# Patient Record
Sex: Female | Born: 1965 | Race: Black or African American | Hispanic: No | Marital: Single | Smoking: Never smoker
Health system: Southern US, Community
[De-identification: ages and names within clinical notes are randomized; demographics above are authoritative.]

## PROBLEM LIST (undated history)

## (undated) DIAGNOSIS — R55 Syncope and collapse: Secondary | ICD-10-CM

## (undated) DIAGNOSIS — I10 Essential (primary) hypertension: Secondary | ICD-10-CM

## (undated) DIAGNOSIS — I2510 Atherosclerotic heart disease of native coronary artery without angina pectoris: Secondary | ICD-10-CM

## (undated) DIAGNOSIS — E785 Hyperlipidemia, unspecified: Secondary | ICD-10-CM

## (undated) DIAGNOSIS — I219 Acute myocardial infarction, unspecified: Secondary | ICD-10-CM

## (undated) HISTORY — DX: Essential (primary) hypertension: I10

## (undated) HISTORY — DX: Acute myocardial infarction, unspecified: I21.9

## (undated) HISTORY — DX: Atherosclerotic heart disease of native coronary artery without angina pectoris: I25.10

## (undated) HISTORY — DX: Syncope and collapse: R55

## (undated) HISTORY — DX: Hyperlipidemia, unspecified: E78.5

---

## 2004-05-11 ENCOUNTER — Other Ambulatory Visit: Admission: RE | Admit: 2004-05-11 | Discharge: 2004-05-11 | Payer: Self-pay | Admitting: Obstetrics and Gynecology

## 2004-05-12 ENCOUNTER — Other Ambulatory Visit: Admission: RE | Admit: 2004-05-12 | Discharge: 2004-05-12 | Payer: Self-pay | Admitting: Obstetrics and Gynecology

## 2005-04-23 ENCOUNTER — Ambulatory Visit: Payer: Self-pay | Admitting: Internal Medicine

## 2005-04-30 ENCOUNTER — Ambulatory Visit: Payer: Self-pay | Admitting: *Deleted

## 2005-05-18 ENCOUNTER — Ambulatory Visit: Payer: Self-pay | Admitting: Internal Medicine

## 2005-05-21 ENCOUNTER — Ambulatory Visit: Payer: Self-pay | Admitting: Family Medicine

## 2005-05-25 ENCOUNTER — Ambulatory Visit: Payer: Self-pay | Admitting: Internal Medicine

## 2005-06-24 ENCOUNTER — Encounter (INDEPENDENT_AMBULATORY_CARE_PROVIDER_SITE_OTHER): Payer: Self-pay | Admitting: Internal Medicine

## 2005-06-24 LAB — CONVERTED CEMR LAB

## 2005-06-29 ENCOUNTER — Ambulatory Visit: Payer: Self-pay | Admitting: Internal Medicine

## 2005-07-02 ENCOUNTER — Ambulatory Visit: Payer: Self-pay | Admitting: Internal Medicine

## 2005-07-27 ENCOUNTER — Ambulatory Visit: Payer: Self-pay | Admitting: Family Medicine

## 2005-08-10 ENCOUNTER — Inpatient Hospital Stay (HOSPITAL_COMMUNITY): Admission: AD | Admit: 2005-08-10 | Discharge: 2005-08-10 | Payer: Self-pay | Admitting: Obstetrics and Gynecology

## 2005-11-16 ENCOUNTER — Ambulatory Visit: Payer: Self-pay | Admitting: Internal Medicine

## 2006-02-14 ENCOUNTER — Ambulatory Visit: Payer: Self-pay | Admitting: Internal Medicine

## 2006-03-21 ENCOUNTER — Ambulatory Visit: Payer: Self-pay | Admitting: Family Medicine

## 2006-10-04 ENCOUNTER — Ambulatory Visit: Payer: Self-pay | Admitting: Internal Medicine

## 2006-11-23 LAB — CONVERTED CEMR LAB

## 2006-12-16 ENCOUNTER — Ambulatory Visit: Payer: Self-pay | Admitting: Family Medicine

## 2007-03-22 ENCOUNTER — Inpatient Hospital Stay (HOSPITAL_COMMUNITY): Admission: AD | Admit: 2007-03-22 | Discharge: 2007-03-22 | Payer: Self-pay | Admitting: Obstetrics and Gynecology

## 2007-03-24 ENCOUNTER — Ambulatory Visit (HOSPITAL_COMMUNITY): Admission: RE | Admit: 2007-03-24 | Discharge: 2007-03-24 | Payer: Self-pay | Admitting: Obstetrics and Gynecology

## 2007-06-10 ENCOUNTER — Telehealth (INDEPENDENT_AMBULATORY_CARE_PROVIDER_SITE_OTHER): Payer: Self-pay | Admitting: *Deleted

## 2007-06-11 ENCOUNTER — Encounter (INDEPENDENT_AMBULATORY_CARE_PROVIDER_SITE_OTHER): Payer: Self-pay | Admitting: *Deleted

## 2007-06-13 ENCOUNTER — Encounter (INDEPENDENT_AMBULATORY_CARE_PROVIDER_SITE_OTHER): Payer: Self-pay | Admitting: Internal Medicine

## 2007-06-13 DIAGNOSIS — J309 Allergic rhinitis, unspecified: Secondary | ICD-10-CM | POA: Insufficient documentation

## 2007-06-13 DIAGNOSIS — M542 Cervicalgia: Secondary | ICD-10-CM

## 2007-06-13 DIAGNOSIS — I10 Essential (primary) hypertension: Secondary | ICD-10-CM

## 2007-06-16 ENCOUNTER — Ambulatory Visit: Payer: Self-pay | Admitting: Family Medicine

## 2007-06-16 LAB — CONVERTED CEMR LAB
ALT: 9 units/L (ref 0–35)
AST: 12 units/L (ref 0–37)
Albumin: 4.4 g/dL (ref 3.5–5.2)
Alkaline Phosphatase: 83 units/L (ref 39–117)
BUN: 16 mg/dL (ref 6–23)
Basophils Absolute: 0 10*3/uL (ref 0.0–0.1)
Basophils Relative: 0 % (ref 0–1)
CO2: 29 meq/L (ref 19–32)
Calcium: 9.2 mg/dL (ref 8.4–10.5)
Chloride: 101 meq/L (ref 96–112)
Cholesterol: 191 mg/dL (ref 0–200)
Creatinine, Ser: 0.88 mg/dL (ref 0.40–1.20)
Eosinophils Absolute: 0 10*3/uL (ref 0.0–0.7)
Eosinophils Relative: 1 % (ref 0–5)
Glucose, Bld: 97 mg/dL (ref 70–99)
HCT: 40.5 % (ref 36.0–46.0)
HDL: 75 mg/dL (ref >39)
Hemoglobin: 12.7 g/dL (ref 12.0–15.0)
LDL Cholesterol: 95 mg/dL (ref 0–99)
Lymphocytes Relative: 28 % (ref 12–46)
Lymphs Abs: 1.6 10*3/uL (ref 0.7–3.3)
MCHC: 31.4 g/dL (ref 30.0–36.0)
MCV: 92 fL (ref 78.0–100.0)
Monocytes Absolute: 0.3 10*3/uL (ref 0.2–0.7)
Monocytes Relative: 4 % (ref 3–11)
Neutro Abs: 3.8 10*3/uL (ref 1.7–7.7)
Neutrophils Relative %: 66 % (ref 43–77)
Platelets: 248 10*3/uL (ref 150–400)
Potassium: 4.2 meq/L (ref 3.5–5.3)
RBC: 4.4 M/uL (ref 3.87–5.11)
RDW: 13.9 % (ref 11.5–14.0)
Sodium: 142 meq/L (ref 135–145)
Total Bilirubin: 0.4 mg/dL (ref 0.3–1.2)
Total CHOL/HDL Ratio: 2.5
Total Protein: 7.3 g/dL (ref 6.0–8.3)
Triglycerides: 106 mg/dL (ref <150)
VLDL: 21 mg/dL (ref 0–40)
WBC: 5.7 10*3/uL (ref 4.0–10.5)

## 2008-03-23 ENCOUNTER — Ambulatory Visit: Payer: Self-pay | Admitting: Family Medicine

## 2008-03-23 ENCOUNTER — Encounter (INDEPENDENT_AMBULATORY_CARE_PROVIDER_SITE_OTHER): Payer: Self-pay | Admitting: Family Medicine

## 2008-03-23 LAB — CONVERTED CEMR LAB
Bilirubin Urine: NEGATIVE
Chlamydia, DNA Probe: NEGATIVE
GC Probe Amp, Genital: NEGATIVE
Glucose, Urine, Semiquant: NEGATIVE
Nitrite: NEGATIVE
Pap Smear: NORMAL
Protein, U semiquant: 30
Specific Gravity, Urine: 1.02
Urobilinogen, UA: 1
WBC Urine, dipstick: NEGATIVE
pH: 6.5

## 2008-04-09 ENCOUNTER — Telehealth (INDEPENDENT_AMBULATORY_CARE_PROVIDER_SITE_OTHER): Payer: Self-pay | Admitting: *Deleted

## 2008-04-15 ENCOUNTER — Ambulatory Visit (HOSPITAL_COMMUNITY): Admission: RE | Admit: 2008-04-15 | Discharge: 2008-04-15 | Payer: Self-pay | Admitting: Family Medicine

## 2008-04-22 ENCOUNTER — Telehealth (INDEPENDENT_AMBULATORY_CARE_PROVIDER_SITE_OTHER): Payer: Self-pay | Admitting: *Deleted

## 2008-04-30 ENCOUNTER — Ambulatory Visit: Payer: Self-pay | Admitting: Family Medicine

## 2008-06-08 ENCOUNTER — Telehealth (INDEPENDENT_AMBULATORY_CARE_PROVIDER_SITE_OTHER): Payer: Self-pay | Admitting: *Deleted

## 2008-06-15 ENCOUNTER — Ambulatory Visit: Payer: Self-pay | Admitting: Family Medicine

## 2008-06-15 DIAGNOSIS — N760 Acute vaginitis: Secondary | ICD-10-CM | POA: Insufficient documentation

## 2009-01-25 ENCOUNTER — Ambulatory Visit: Payer: Self-pay | Admitting: Family Medicine

## 2009-01-25 DIAGNOSIS — B356 Tinea cruris: Secondary | ICD-10-CM

## 2009-01-25 LAB — CONVERTED CEMR LAB: Blood Glucose, Fingerstick: 98

## 2009-05-31 ENCOUNTER — Ambulatory Visit: Payer: Self-pay | Admitting: Physician Assistant

## 2009-06-01 DIAGNOSIS — R82998 Other abnormal findings in urine: Secondary | ICD-10-CM

## 2009-06-03 ENCOUNTER — Ambulatory Visit: Payer: Self-pay | Admitting: Physician Assistant

## 2009-06-03 LAB — CONVERTED CEMR LAB
Blood Glucose, AC Bkfst: 88 mg/dL
Microalb, Ur: 5.21 mg/dL — ABNORMAL HIGH (ref 0.00–1.89)
Rapid HIV Screen: NEGATIVE

## 2009-06-04 ENCOUNTER — Encounter: Payer: Self-pay | Admitting: Physician Assistant

## 2009-06-06 ENCOUNTER — Encounter: Payer: Self-pay | Admitting: Physician Assistant

## 2009-06-06 ENCOUNTER — Telehealth: Payer: Self-pay | Admitting: Physician Assistant

## 2009-06-10 ENCOUNTER — Encounter: Payer: Self-pay | Admitting: Physician Assistant

## 2009-06-10 DIAGNOSIS — R809 Proteinuria, unspecified: Secondary | ICD-10-CM | POA: Insufficient documentation

## 2009-06-22 ENCOUNTER — Ambulatory Visit (HOSPITAL_COMMUNITY): Admission: RE | Admit: 2009-06-22 | Discharge: 2009-06-22 | Payer: Self-pay | Admitting: Internal Medicine

## 2009-06-27 ENCOUNTER — Telehealth (INDEPENDENT_AMBULATORY_CARE_PROVIDER_SITE_OTHER): Payer: Self-pay | Admitting: Nurse Practitioner

## 2009-07-13 ENCOUNTER — Encounter (INDEPENDENT_AMBULATORY_CARE_PROVIDER_SITE_OTHER): Payer: Self-pay | Admitting: Nurse Practitioner

## 2009-07-21 ENCOUNTER — Other Ambulatory Visit: Admission: RE | Admit: 2009-07-21 | Discharge: 2009-07-21 | Payer: Self-pay | Admitting: Internal Medicine

## 2009-07-21 ENCOUNTER — Encounter (INDEPENDENT_AMBULATORY_CARE_PROVIDER_SITE_OTHER): Payer: Self-pay | Admitting: Nurse Practitioner

## 2009-07-21 ENCOUNTER — Ambulatory Visit: Payer: Self-pay | Admitting: Nurse Practitioner

## 2009-07-21 DIAGNOSIS — E669 Obesity, unspecified: Secondary | ICD-10-CM

## 2009-07-21 DIAGNOSIS — R7309 Other abnormal glucose: Secondary | ICD-10-CM

## 2009-07-21 LAB — CONVERTED CEMR LAB

## 2009-07-22 ENCOUNTER — Encounter (INDEPENDENT_AMBULATORY_CARE_PROVIDER_SITE_OTHER): Payer: Self-pay | Admitting: Nurse Practitioner

## 2009-08-08 ENCOUNTER — Encounter (INDEPENDENT_AMBULATORY_CARE_PROVIDER_SITE_OTHER): Payer: Self-pay | Admitting: *Deleted

## 2009-10-06 ENCOUNTER — Ambulatory Visit: Payer: Self-pay | Admitting: Internal Medicine

## 2009-10-06 LAB — CONVERTED CEMR LAB
Bilirubin Urine: NEGATIVE
Glucose, Urine, Semiquant: NEGATIVE
Ketones, urine, test strip: NEGATIVE
Nitrite: NEGATIVE
Protein, U semiquant: 30
Specific Gravity, Urine: 1.025
Urobilinogen, UA: 0.2
pH: 6

## 2009-10-07 ENCOUNTER — Encounter (INDEPENDENT_AMBULATORY_CARE_PROVIDER_SITE_OTHER): Payer: Self-pay | Admitting: Nurse Practitioner

## 2009-10-10 ENCOUNTER — Encounter (INDEPENDENT_AMBULATORY_CARE_PROVIDER_SITE_OTHER): Payer: Self-pay | Admitting: Nurse Practitioner

## 2009-11-07 ENCOUNTER — Ambulatory Visit: Payer: Self-pay | Admitting: Nurse Practitioner

## 2010-10-15 ENCOUNTER — Encounter: Payer: Self-pay | Admitting: Internal Medicine

## 2010-10-16 ENCOUNTER — Encounter: Payer: Self-pay | Admitting: Family Medicine

## 2010-10-22 LAB — CONVERTED CEMR LAB
ALT: 13 units/L (ref 0–35)
AST: 16 units/L (ref 0–37)
Albumin: 4.5 g/dL (ref 3.5–5.2)
Alkaline Phosphatase: 73 units/L (ref 39–117)
BUN: 12 mg/dL (ref 6–23)
BUN: 14 mg/dL (ref 6–23)
Basophils Absolute: 0 10*3/uL (ref 0.0–0.1)
Basophils Relative: 0 % (ref 0–1)
Bilirubin Urine: NEGATIVE
Blood Glucose, Fingerstick: 108
CO2: 23 meq/L (ref 19–32)
CO2: 28 meq/L (ref 19–32)
Calcium: 9.6 mg/dL (ref 8.4–10.5)
Calcium: 9.8 mg/dL (ref 8.4–10.5)
Chlamydia, DNA Probe: NEGATIVE
Chloride: 103 meq/L (ref 96–112)
Chloride: 104 meq/L (ref 96–112)
Creatinine, Ser: 0.86 mg/dL (ref 0.40–1.20)
Creatinine, Ser: 0.96 mg/dL (ref 0.40–1.20)
Eosinophils Absolute: 0.1 10*3/uL (ref 0.0–0.7)
Eosinophils Relative: 1 % (ref 0–5)
GC Probe Amp, Genital: NEGATIVE
Glucose, Bld: 103 mg/dL — ABNORMAL HIGH (ref 70–99)
Glucose, Bld: 126 mg/dL — ABNORMAL HIGH (ref 70–99)
Glucose, Urine, Semiquant: NEGATIVE
HCT: 38 % (ref 36.0–46.0)
Hemoglobin: 12.1 g/dL (ref 12.0–15.0)
Hep B S AB Quant (Post): 256 milliintl units/mL
Hep B S Ab: POSITIVE — AB
Hgb A1c MFr Bld: 5.7 %
KOH Prep: NEGATIVE
Lymphocytes Relative: 29 % (ref 12–46)
Lymphs Abs: 2.4 10*3/uL (ref 0.7–4.0)
MCHC: 31.8 g/dL (ref 30.0–36.0)
MCV: 88 fL (ref 78.0–100.0)
Microalb, Ur: 0.66 mg/dL (ref 0.00–1.89)
Monocytes Absolute: 0.4 10*3/uL (ref 0.1–1.0)
Monocytes Relative: 5 % (ref 3–12)
Neutro Abs: 5.4 10*3/uL (ref 1.7–7.7)
Neutrophils Relative %: 65 % (ref 43–77)
Nitrite: NEGATIVE
OCCULT 1: NEGATIVE
Platelets: 260 10*3/uL (ref 150–400)
Potassium: 3.6 meq/L (ref 3.5–5.3)
Potassium: 4.2 meq/L (ref 3.5–5.3)
Protein, U semiquant: NEGATIVE
RBC: 4.32 M/uL (ref 3.87–5.11)
RDW: 14.1 % (ref 11.5–15.5)
Sodium: 140 meq/L (ref 135–145)
Sodium: 143 meq/L (ref 135–145)
Specific Gravity, Urine: 1.02
TSH: 0.909 microintl units/mL (ref 0.350–4.500)
Total Bilirubin: 0.3 mg/dL (ref 0.3–1.2)
Total Protein: 7 g/dL (ref 6.0–8.3)
Urobilinogen, UA: 0.2
WBC Urine, dipstick: NEGATIVE
WBC: 8.2 10*3/uL (ref 4.0–10.5)
pH: 5

## 2010-10-25 NOTE — Letter (Signed)
Summary: *HSN Results Follow up  [facility]  [address] [place], [place] [address]   Phone: [phone]  Fax: [phone]      10/10/2009   Jill Ball [address] [place], [place]  [address]   Dear  Jill Ball,                            ____S.Drinkard,FNP   ____D. Gore,FNP       ____B. McPherson,MD   ____V. Rankins,MD    ____E. Mulberry,MD    __X__N. Daphine Deutscher, FNP  ____D. Reche Dixon, MD    ____K. Philipp Deputy, MD    ____Other     This letter is to inform you that your recent test(s):  _______Pap Smear    ___X____Lab Test     _______X-ray    __X_____ is within acceptable limits  _______ requires a medication change  _______ requires a follow-up lab visit  _______ requires a follow-up visit with your provider   Comments: Urine culture is now ok. Infection has cleared.       _________________________________________________________ If you have any questions, please contact our office [phone].                    Sincerely,    Jill Prom FNP [facility]

## 2010-10-25 NOTE — Assessment & Plan Note (Signed)
Summary: Acute - Allergic Rhinitis   Vital Signs:  Patient profile:   45 year old female LMP:     09/2009 Weight:      227.5 pounds BMI:     37.42 BSA:     2.10 Temp:     98.8 degrees F oral Pulse rate:   103 / minute Pulse rhythm:   regular Resp:     20 per minute BP sitting:   132 / 85  (left arm) Cuff size:   large  Vitals Entered By: Levon Hedger (November 07, 2009 2:10 PM) CC: cough x 1 1/2 week wake up with headache, dry mouth, tiredness, chest soreness, and throat tickles alot, Hypertension Management Is Patient Diabetic? No Pain Assessment Patient in pain? no       Does patient need assistance? Functional Status Self care Ambulation Normal LMP (date): 09/2009 LMP - Character: normal     Enter LMP: 09/2009 Last PAP Result  Specimen Adequacy: Satisfactory for evaluation.   Interpretation/Result:Negative for intraepithelial Lesion or Malignancy.   Interpretation/Result:Shift in flora c/w Bacterial Vaginosis.  Interpretation/Result:Reactive Changes.     Primary Care Provider:  Tereso Newcomer PA-C  CC:  cough x 1 1/2 week wake up with headache, dry mouth, tiredness, chest soreness, and throat tickles alot, and Hypertension Management.  History of Present Illness:  Pt into the office with complaints of upper respiratory symptoms that started 2 weeks ago. +dry cough (non-productive) +headache +sneezing -ear pain Sore throat from cough  +chest pain from cough  +dry mouth  -nausea  -vomiting  Took Tussin DM for cough without resolution of symptoms  Hypertension History:      She denies headache, chest pain, and palpitations.  She notes no problems with any antihypertensive medication side effects.        Positive major cardiovascular risk factors include hypertension.  Negative major cardiovascular risk factors include female age less than 69 years old, no history of diabetes, negative family history for ischemic heart disease, and non-tobacco-user status.          Further assessment for target organ damage reveals no history of ASHD, cardiac end-organ damage (CHF/LVH), stroke/TIA, peripheral vascular disease, renal insufficiency, or hypertensive retinopathy.     Habits & Providers  Alcohol-Tobacco-Diet     Alcohol drinks/day: 0     Tobacco Status: never     Passive Smoke Exposure: no  Exercise-Depression-Behavior     Does Patient Exercise: no     Depression Counseling: not indicated; screening negative for depression     Drug Use: no     Seat Belt Use: 100     Sun Exposure: occasionally  Comments: admits to some Second hand smoke from her mother  Allergies (verified): No Known Drug Allergies  Review of Systems General:  Denies fever. ENT:  Denies earache, nasal congestion, and sore throat. CV:  Denies chest pain or discomfort. Resp:  Complains of cough. GI:  Denies abdominal pain, nausea, and vomiting.  Physical Exam  General:  alert.   Head:  normocephalic.   Ears:  bil TM with clear fluid Nose:  inflammed turbinates Mouth:  fair dentition.   Lungs:  normal breath sounds.   Heart:  normal rate and regular rhythm.   Abdomen:  normal bowel sounds.   Neurologic:  alert & oriented X3.     Impression & Recommendations:  Problem # 1:  ALLERGIC RHINITIS, CHRONIC (ICD-477.9) advised pt of dx handout given Her updated medication list for this problem includes:  Fluticasone Propionate 50 Mcg/act Susp (Fluticasone propionate) ..... One inhalation two times a day  **hold head down""    Loratadine 10 Mg Tabs (Loratadine) ..... One tablet by mouth daily for allergies  Complete Medication List: 1)  Hydrochlorothiazide 25 Mg Tabs (Hydrochlorothiazide) .... Take one a pill each morning 2)  Fluticasone Propionate 50 Mcg/act Susp (Fluticasone propionate) .... One inhalation two times a day  **hold head down"" 3)  Loratadine 10 Mg Tabs (Loratadine) .... One tablet by mouth daily for allergies  Hypertension Assessment/Plan:       The patient's hypertensive risk group is category A: No risk factors and no target organ damage.  Her calculated 10 year risk of coronary heart disease is 1 %.  Today's blood pressure is 132/85.  Her blood pressure goal is < 140/90.  Patient Instructions: 1)  Symptoms are likely caused by allergic triggers. 2)  Use humidifier or boil hot water on the stove 3)  avoid triggers 4)  Use nasal spray in each nostril two times a day (hold head down) 5)  AND 6)  loratadine 10mg  by mouth daily 7)  Follow up as needed Prescriptions: LORATADINE 10 MG TABS (LORATADINE) One tablet by mouth daily for allergies  #30 x 3   Entered and Authorized by:   Lehman Prom FNP   Signed by:   Lehman Prom FNP on 11/07/2009   Method used:   Print then Give to Patient   RxID:   [id number] FLUTICASONE PROPIONATE 50 MCG/ACT SUSP (FLUTICASONE PROPIONATE) One inhalation two times a day  **hold head down""  #1 x 3   Entered and Authorized by:   Lehman Prom FNP   Signed by:   Lehman Prom FNP on 11/07/2009   Method used:   Print then Give to Patient   RxID:   [id number]

## 2011-07-11 LAB — URINALYSIS, ROUTINE W REFLEX MICROSCOPIC
Bilirubin Urine: NEGATIVE
Glucose, UA: NEGATIVE
Ketones, ur: 15 — AB
Leukocytes, UA: NEGATIVE
Nitrite: NEGATIVE
Protein, ur: NEGATIVE
Specific Gravity, Urine: 1.025
Urobilinogen, UA: 1
pH: 6

## 2011-07-11 LAB — DIFFERENTIAL
Basophils Absolute: 0
Basophils Relative: 0
Eosinophils Absolute: 0.1
Eosinophils Relative: 1
Lymphs Abs: 1.5
Neutrophils Relative %: 70

## 2011-07-11 LAB — CBC
HCT: 38.3
Hemoglobin: 12.7
MCHC: 33.1
MCV: 86.9
Platelets: 234
RBC: 4.41
RDW: 13.9
WBC: 6.5

## 2011-07-11 LAB — URINE MICROSCOPIC-ADD ON

## 2011-07-11 LAB — POCT PREGNANCY, URINE
Operator id: 263291
Preg Test, Ur: NEGATIVE

## 2012-09-24 HISTORY — PX: CORONARY ANGIOPLASTY: SHX604

## 2013-01-18 ENCOUNTER — Ambulatory Visit: Payer: Self-pay | Admitting: Internal Medicine

## 2013-02-17 ENCOUNTER — Encounter: Payer: Self-pay | Admitting: Physician Assistant

## 2013-02-22 ENCOUNTER — Encounter: Payer: Self-pay | Admitting: Physician Assistant

## 2013-03-24 ENCOUNTER — Encounter: Payer: Self-pay | Admitting: Physician Assistant

## 2013-06-24 HISTORY — PX: CARDIAC CATHETERIZATION: SHX172

## 2013-07-06 LAB — BASIC METABOLIC PANEL
Anion Gap: 7 (ref 7–16)
BUN: 16 mg/dL (ref 7–18)
Calcium, Total: 9 mg/dL (ref 8.5–10.1)
Co2: 27 mmol/L (ref 21–32)
Creatinine: 0.96 mg/dL (ref 0.60–1.30)
EGFR (Non-African Amer.): >60
Glucose: 96 mg/dL (ref 65–99)
Osmolality: 277 (ref 275–301)
Potassium: 3.6 mmol/L (ref 3.5–5.1)
Sodium: 138 mmol/L (ref 136–145)

## 2013-07-06 LAB — TROPONIN I: Troponin-I: 6.8 ng/mL — ABNORMAL HIGH

## 2013-07-06 LAB — CBC
HGB: 11.7 g/dL — ABNORMAL LOW (ref 12.0–16.0)
MCH: 28.4 pg (ref 26.0–34.0)
MCV: 84 fL (ref 80–100)
Platelet: 229 10*3/uL (ref 150–440)

## 2013-07-07 ENCOUNTER — Inpatient Hospital Stay: Payer: Self-pay | Admitting: Internal Medicine

## 2013-07-07 DIAGNOSIS — R079 Chest pain, unspecified: Secondary | ICD-10-CM

## 2013-07-07 DIAGNOSIS — I359 Nonrheumatic aortic valve disorder, unspecified: Secondary | ICD-10-CM

## 2013-07-07 DIAGNOSIS — I2510 Atherosclerotic heart disease of native coronary artery without angina pectoris: Secondary | ICD-10-CM

## 2013-07-07 DIAGNOSIS — I10 Essential (primary) hypertension: Secondary | ICD-10-CM

## 2013-07-07 DIAGNOSIS — R55 Syncope and collapse: Secondary | ICD-10-CM

## 2013-07-07 DIAGNOSIS — I214 Non-ST elevation (NSTEMI) myocardial infarction: Secondary | ICD-10-CM

## 2013-07-07 LAB — CK TOTAL AND CKMB (NOT AT ARMC)
CK, Total: 334 U/L — ABNORMAL HIGH (ref 21–215)
CK, Total: 254 U/L — ABNORMAL HIGH (ref 21–215)
CK-MB: 7.1 ng/mL — ABNORMAL HIGH (ref 0.5–3.6)
CK-MB: 3 ng/mL (ref 0.5–3.6)

## 2013-07-07 LAB — LIPID PANEL
Cholesterol: 167 mg/dL (ref 0–200)
Ldl Cholesterol, Calc: 74 mg/dL (ref 0–100)
Triglycerides: 94 mg/dL (ref 0–200)
VLDL Cholesterol, Calc: 19 mg/dL (ref 5–40)

## 2013-07-07 LAB — TROPONIN I
Troponin-I: 5.6 ng/mL — ABNORMAL HIGH
Troponin-I: 4 ng/mL — ABNORMAL HIGH
Troponin-I: 3.4 ng/mL — ABNORMAL HIGH

## 2013-07-07 LAB — HCG, QUANTITATIVE, PREGNANCY: Beta Hcg, Quant.: <1 m[IU]/mL — ABNORMAL LOW

## 2013-07-08 ENCOUNTER — Encounter: Payer: Self-pay | Admitting: Cardiovascular Disease

## 2013-07-08 DIAGNOSIS — I214 Non-ST elevation (NSTEMI) myocardial infarction: Secondary | ICD-10-CM

## 2013-07-08 LAB — CBC WITH DIFFERENTIAL/PLATELET
Basophil #: 0 10*3/uL (ref 0.0–0.1)
HCT: 34.7 % — ABNORMAL LOW (ref 35.0–47.0)
Lymphocyte #: 1.7 10*3/uL (ref 1.0–3.6)
Lymphocyte %: 23.9 %
MCHC: 33.6 g/dL (ref 32.0–36.0)
MCV: 85 fL (ref 80–100)
Monocyte #: 0.4 x10 3/mm (ref 0.2–0.9)
Monocyte %: 5.4 %
Neutrophil #: 5 10*3/uL (ref 1.4–6.5)
Neutrophil %: 69.7 %
Platelet: 210 10*3/uL (ref 150–440)
RDW: 14.9 % — ABNORMAL HIGH (ref 11.5–14.5)

## 2013-07-08 LAB — BASIC METABOLIC PANEL
BUN: 10 mg/dL (ref 7–18)
EGFR (African American): >60
Osmolality: 273 (ref 275–301)
Potassium: 3.8 mmol/L (ref 3.5–5.1)
Sodium: 137 mmol/L (ref 136–145)

## 2013-07-09 ENCOUNTER — Inpatient Hospital Stay: Payer: Self-pay | Admitting: Internal Medicine

## 2013-07-09 ENCOUNTER — Telehealth: Payer: Self-pay

## 2013-07-09 LAB — APTT: Activated PTT: 32 secs (ref 23.6–35.9)

## 2013-07-09 LAB — COMPREHENSIVE METABOLIC PANEL
BUN: 11 mg/dL (ref 7–18)
Bilirubin,Total: 0.3 mg/dL (ref 0.2–1.0)
Calcium, Total: 9.1 mg/dL (ref 8.5–10.1)
Creatinine: 0.97 mg/dL (ref 0.60–1.30)
EGFR (African American): >60
EGFR (Non-African Amer.): >60
Osmolality: 274 (ref 275–301)
Potassium: 3.5 mmol/L (ref 3.5–5.1)
SGOT(AST): 32 U/L (ref 15–37)
Sodium: 138 mmol/L (ref 136–145)
Total Protein: 7.6 g/dL (ref 6.4–8.2)

## 2013-07-09 LAB — CK TOTAL AND CKMB (NOT AT ARMC)
CK, Total: 131 U/L (ref 21–215)
CK-MB: 0.7 ng/mL (ref 0.5–3.6)

## 2013-07-09 LAB — CBC
MCHC: 33.4 g/dL (ref 32.0–36.0)
MCV: 84 fL (ref 80–100)
RBC: 4.34 10*6/uL (ref 3.80–5.20)
WBC: 8.8 10*3/uL (ref 3.6–11.0)

## 2013-07-09 NOTE — Telephone Encounter (Signed)
Spoke w/ pt.  She states that she was recently released from [facility] s/p MI, s/p cath and stent placement, was told to call our office if symptoms recurred. States that she is in her car, her son is driving, she is having chest discomfort, stomach is burning "like acid reflux pain".  She took her ranitidine, but it did not help. Chest discomfort x 2 hourse. States that the discomfort is worsening, radiating to her neck, she is short of breath, and nauseous.  Pt reports that "I felt this same way when I had my heart attack". Pt will have her son take her to the emergency department, as she is "scared it is happening again".

## 2013-07-10 DIAGNOSIS — E785 Hyperlipidemia, unspecified: Secondary | ICD-10-CM

## 2013-07-10 DIAGNOSIS — I2510 Atherosclerotic heart disease of native coronary artery without angina pectoris: Secondary | ICD-10-CM

## 2013-07-10 DIAGNOSIS — R079 Chest pain, unspecified: Secondary | ICD-10-CM

## 2013-07-10 DIAGNOSIS — E876 Hypokalemia: Secondary | ICD-10-CM

## 2013-07-10 DIAGNOSIS — Z9861 Coronary angioplasty status: Secondary | ICD-10-CM

## 2013-07-10 LAB — CBC WITH DIFFERENTIAL/PLATELET
Basophil #: 0 10*3/uL (ref 0.0–0.1)
Eosinophil %: 0.7 %
HCT: 31.4 % — ABNORMAL LOW (ref 35.0–47.0)
HGB: 10.7 g/dL — ABNORMAL LOW (ref 12.0–16.0)
MCHC: 34.1 g/dL (ref 32.0–36.0)
Monocyte #: 0.5 x10 3/mm (ref 0.2–0.9)
Neutrophil #: 5 10*3/uL (ref 1.4–6.5)
Platelet: 213 10*3/uL (ref 150–440)
WBC: 7.8 10*3/uL (ref 3.6–11.0)

## 2013-07-10 LAB — TROPONIN I
Troponin-I: 1.2 ng/mL — ABNORMAL HIGH
Troponin-I: 0.71 ng/mL — ABNORMAL HIGH

## 2013-07-10 LAB — BASIC METABOLIC PANEL
Anion Gap: 4 — ABNORMAL LOW (ref 7–16)
BUN: 12 mg/dL (ref 7–18)
Chloride: 107 mmol/L (ref 98–107)
Creatinine: 0.91 mg/dL (ref 0.60–1.30)
EGFR (African American): >60
EGFR (Non-African Amer.): >60
Glucose: 106 mg/dL — ABNORMAL HIGH (ref 65–99)
Osmolality: 281 (ref 275–301)
Potassium: 3.3 mmol/L — ABNORMAL LOW (ref 3.5–5.1)
Sodium: 141 mmol/L (ref 136–145)

## 2013-07-10 LAB — APTT
Activated PTT: 132.1 secs — ABNORMAL HIGH (ref 23.6–35.9)
Activated PTT: 108.5 secs — ABNORMAL HIGH (ref 23.6–35.9)

## 2013-07-10 LAB — CK TOTAL AND CKMB (NOT AT ARMC): CK, Total: 98 U/L (ref 21–215)

## 2013-07-12 ENCOUNTER — Emergency Department: Payer: Self-pay | Admitting: Emergency Medicine

## 2013-07-12 LAB — COMPREHENSIVE METABOLIC PANEL
Albumin: 3.5 g/dL (ref 3.4–5.0)
Alkaline Phosphatase: 75 U/L (ref 50–136)
Anion Gap: 4 — ABNORMAL LOW (ref 7–16)
BUN: 8 mg/dL (ref 7–18)
Bilirubin,Total: 0.3 mg/dL (ref 0.2–1.0)
Chloride: 109 mmol/L — ABNORMAL HIGH (ref 98–107)
Co2: 26 mmol/L (ref 21–32)
Creatinine: 0.81 mg/dL (ref 0.60–1.30)
EGFR (African American): >60
EGFR (Non-African Amer.): >60
SGOT(AST): 30 U/L (ref 15–37)
SGPT (ALT): 18 U/L (ref 12–78)
Total Protein: 7 g/dL (ref 6.4–8.2)

## 2013-07-12 LAB — CBC WITH DIFFERENTIAL/PLATELET
Basophil %: 0.4 %
Eosinophil #: 0 10*3/uL (ref 0.0–0.7)
Eosinophil %: 0.6 %
HCT: 35.2 % (ref 35.0–47.0)
HGB: 11.7 g/dL — ABNORMAL LOW (ref 12.0–16.0)
Lymphocyte #: 1.4 10*3/uL (ref 1.0–3.6)
MCH: 28.4 pg (ref 26.0–34.0)
MCHC: 33.3 g/dL (ref 32.0–36.0)
MCV: 86 fL (ref 80–100)
Neutrophil #: 5 10*3/uL (ref 1.4–6.5)
Neutrophil %: 72.6 %
Platelet: 234 10*3/uL (ref 150–440)
RBC: 4.12 10*6/uL (ref 3.80–5.20)

## 2013-07-12 LAB — CK TOTAL AND CKMB (NOT AT ARMC): CK, Total: 95 U/L (ref 21–215)

## 2013-07-12 LAB — TROPONIN I: Troponin-I: 0.04 ng/mL

## 2013-07-12 LAB — PRO B NATRIURETIC PEPTIDE: B-Type Natriuretic Peptide: 142 pg/mL — ABNORMAL HIGH (ref 0–125)

## 2013-07-13 ENCOUNTER — Encounter: Payer: Self-pay | Admitting: *Deleted

## 2013-07-13 ENCOUNTER — Encounter: Payer: Self-pay | Admitting: Physician Assistant

## 2013-07-13 ENCOUNTER — Ambulatory Visit (INDEPENDENT_AMBULATORY_CARE_PROVIDER_SITE_OTHER): Payer: BC Managed Care – PPO | Admitting: Physician Assistant

## 2013-07-13 VITALS — BP 122/83 | HR 70 | Ht 67.0 in | Wt 213.5 lb

## 2013-07-13 DIAGNOSIS — I10 Essential (primary) hypertension: Secondary | ICD-10-CM

## 2013-07-13 DIAGNOSIS — E669 Obesity, unspecified: Secondary | ICD-10-CM

## 2013-07-13 DIAGNOSIS — R079 Chest pain, unspecified: Secondary | ICD-10-CM

## 2013-07-13 DIAGNOSIS — I2510 Atherosclerotic heart disease of native coronary artery without angina pectoris: Secondary | ICD-10-CM

## 2013-07-13 DIAGNOSIS — R0602 Shortness of breath: Secondary | ICD-10-CM

## 2013-07-13 DIAGNOSIS — R0789 Other chest pain: Secondary | ICD-10-CM

## 2013-07-13 MED ORDER — ALPRAZOLAM 0.5 MG PO TABS: 0.5000 mg | ORAL_TABLET | Freq: Every evening | ORAL | Status: DC | PRN

## 2013-07-13 NOTE — Assessment & Plan Note (Addendum)
S/p DES-LCx earlier this month. Atypical chest pain may be related to anxiety. Continue ASA, Effient, ARB, BB, statin, NTG SL PRN. Discussed use nitroglycerin for chest discomfort. Will arrange outpatient cardiac rehab. Follow-up in 3 months.

## 2013-07-13 NOTE — Assessment & Plan Note (Signed)
Discussed resuming exercise. Cardiac rehab will help with this.

## 2013-07-13 NOTE — Progress Notes (Signed)
Patient ID: Jill Ball, female   DOB: October 27, 1965, 47 y.o.   MRN: [id number]   Date:  07/13/2013   ID:  Jill Ball, DOB 10/08/1965, MRN [id number]  PCP:  Brayton El, MD  Primary Cardiologist:  Concha Se, MD   History of Present Illness:  Jill Ball is a 47 y.o. African American female w/ PMHx s/f CAD (s/p DES-LCx 06/2013), HTN, HLD and obesity who presents today for follow-up.  She had 90% LCx lesion treated with a DES. She had residual 50% LAD disease. EF > 55%. Coronary vasospasm was noted. She was started on DAPT- ASA/Effient. 2D echo indicated EF 50-55%, impaired LV diastolic dysfunction, mild LA dilatation, mild MR, mild-mod AI, mild TR, RVSP 40 mmHg and mildly elevated PASP. She was discharged in fair condition. She has since been re-admitted for recurrent chest pain felt to be musculoskeletal in origin. Troponins trended down appropriately. No ischemic EKG changes. Pain reproducible on palpation. She was started on NSAIDs, analgesics and muscle relaxers with improvement. She re-presented to the ED yesterday after an episode of chest pressure, shortness of breath, stomach discomfort, lightheadedness and palpitations. D-dimer returned WNL. TnI x 2 returned WNL. She did not find relief with NTG. She was discharged with recommendations to follow-up in the office today.   Her symptoms continue to concern her. She would like to know when she can go back to work. She continues to take ASA/Effient as prescribed.   EKG: NSR, poor R wave progression, TWIs II, III, aVF, LAD, concave ST elevations I, aVL (unchanged from prior tracings)  Wt Readings from Last 3 Encounters:  07/13/13 213 lb 8 oz (96.843 kg)  11/07/09 227 lb 8 oz (103.193 kg)  07/21/09 228 lb (103.42 kg)     Past Medical History  Diagnosis Date  . Hypertension   . MI (myocardial infarction)   . Hyperlipidemia   . Syncope and collapse   . Coronary artery disease     Current Outpatient Prescriptions  Medication Sig  Dispense Refill  . aspirin 81 MG tablet Take 81 mg by mouth daily.      Marland Kitchen atorvastatin (LIPITOR) 20 MG tablet Take 20 mg by mouth daily.      . hydrochlorothiazide (HYDRODIURIL) 25 MG tablet Take 25 mg by mouth daily as needed.       Marland Kitchen losartan (COZAAR) 25 MG tablet Take 25 mg by mouth daily.      . metoprolol tartrate (LOPRESSOR) 25 MG tablet Take 12.5 mg by mouth 2 (two) times daily.      . nitroGLYCERIN (NITROSTAT) 0.4 MG SL tablet Place 0.4 mg under the tongue every 5 (five) minutes as needed for chest pain.      . prasugrel (EFFIENT) 10 MG TABS tablet Take 10 mg by mouth daily.       No current facility-administered medications for this visit.    Allergies:   No Known Allergies  Social History:  The patient  reports that she has never smoked. She does not have any smokeless tobacco history on file. She reports that she drinks alcohol. She reports that she does not use illicit drugs.   Family History:  Family History  Problem Relation Age of Onset  . Hypertension Mother   . Hypertension Father     Review of Systems: General: positive for fatigue, negative for chills, fever, night sweats or weight changes.  Cardiovascular: positive for chest pain, shortness of breath, palpitations, negative for edema, orthopnea, paroxysmal nocturnal dyspnea Dermatological: negative  for rash Respiratory:  negative for cough or wheezing Urologic: negative for hematuria Abdominal: negative for nausea, vomiting, diarrhea, bright red blood per rectum, melena, or hematemesis Neurologic: positive for lightheadedness, negative for visual changes, syncope, or dizziness All other systems reviewed and are otherwise negative except as noted above.  PHYSICAL EXAM: VS:  BP 122/83  Pulse 70  Ht 5\' 7"  (1.702 m)  Wt 213 lb 8 oz (96.843 kg)  BMI 33.43 kg/m2 Well developed, obese appearing female in no acute distress HEENT: normal, PERRL Neck: no JVD or bruits Cardiac:  normal S1, S2; RRR; no murmur or  gallops Lungs:  clear to auscultation bilaterally, no wheezing, rhonchi or rales Abd: soft, nontender, no hepatomegaly, normoactive BS x 4 quads Ext: no edema, cyanosis or clubbing Skin: warm and dry, cap refill < 2 sec Neuro:  CNs 2-12 intact, no focal abnormalities noted Musculoskeletal: strength and tone appropriate for age  Psych: normal affect

## 2013-07-13 NOTE — Procedures (Signed)
Exercise Treadmill Test  Pre-Exercise Testing Evaluation Rhythm: normal sinus  Rate: 72            ST Segments:  concave ST elevations I, aVL < 1 mm     Test  Exercise Tolerance Test Ordering MD: Odella Aquas, PA-C  Interpreting MD: Odella Aquas, PA-C  Unique Test No:   Treadmill:    Indication for ETT: post angioplasty  Contraindication to ETT: No   Stress Modality: exercise - treadmill  Cardiac Imaging Performed: non   Protocol: standard Bruce - maximal  Max BP:  168/82  Max MPHR (bpm):  173 85% MPR (bpm):  147  MPHR obtained (bpm):  149 % MPHR obtained:  85%  Reached 85% MPHR (min:sec):  2:04 Total Exercise Time (min-sec):  6:00  Workload in METS:  7.0 Borg Scale:   Reason ETT Terminated:  patient's desire to stop    ST Segment Analysis At Rest: normal ST segments - no evidence of significant ST depression With Exercise: no evidence of significant ST depression  Other Information Arrhythmia:  No Angina during ETT:  absent (0) Quality of ETT:  diagnostic  ETT Interpretation:  normal - no evidence of ischemia by ST analysis  Comments: No evidence of ST changes. Normal ETT.  Recommendations: Continue ASA, Effient, ARB, BB, statin, NTG SL PRN. Follow-up with PCP for anxiety, atypical chest pain.

## 2013-07-13 NOTE — Assessment & Plan Note (Addendum)
Suspect this is mostly related to anxiety. D-dimer WNL on recent ED visit. Troponins have appropriately down-trended since PCI. ETT performed in the office today. This indicates no ST/T changes. No chest pain. She did have evidence of coronary vasospasm on cath. Have encouraged to use NTG SL PRN. Will also provide with 30 tabs of Xanax for break through anxiety. She knows to follow-up with Dr. Sherryll Burger for ongoing management.

## 2013-07-13 NOTE — Assessment & Plan Note (Signed)
Well-controlled. Continue current antihypertensives.  

## 2013-07-13 NOTE — Patient Instructions (Signed)
Please take sublingual nitroglycerin for chest pressure and/or shortness of breath.   Take alprazolam as prescribed and only as need for break through anxiety. Please follow-up with Dr. Sherryll Burger in 1-2 weeks for definitive management of anxiety.   Your stress test looks normal today. No concerning changes on your EKG.   We will arrange outpatient cardiac rehabilitation to transition you back to an exercise regimen.   Continue aspirin, Effient, metoprolol and atorvastatin as prescribed.   We will see you back in 3 months.

## 2013-07-14 ENCOUNTER — Encounter: Payer: BC Managed Care – PPO | Admitting: Physician Assistant

## 2013-07-14 ENCOUNTER — Encounter: Payer: Self-pay | Admitting: *Deleted

## 2013-07-15 ENCOUNTER — Encounter: Payer: Self-pay | Admitting: Student

## 2013-07-15 ENCOUNTER — Encounter: Payer: Self-pay | Admitting: *Deleted

## 2013-07-17 ENCOUNTER — Encounter: Payer: Self-pay | Admitting: Cardiovascular Disease

## 2013-07-30 ENCOUNTER — Other Ambulatory Visit: Payer: Self-pay | Admitting: *Deleted

## 2013-07-30 MED ORDER — LOSARTAN POTASSIUM 25 MG PO TABS: 25.0000 mg | ORAL_TABLET | Freq: Every day | ORAL | Status: DC

## 2013-07-30 MED ORDER — METOPROLOL TARTRATE 25 MG PO TABS: 12.5000 mg | ORAL_TABLET | Freq: Two times a day (BID) | ORAL | Status: DC

## 2013-08-06 ENCOUNTER — Encounter: Payer: Self-pay | Admitting: Cardiovascular Disease

## 2013-08-12 ENCOUNTER — Ambulatory Visit: Payer: BC Managed Care – PPO | Admitting: Cardiovascular Disease

## 2013-08-13 ENCOUNTER — Encounter: Payer: BC Managed Care – PPO | Admitting: Cardiovascular Disease

## 2013-08-13 ENCOUNTER — Encounter: Payer: Self-pay | Admitting: *Deleted

## 2013-08-24 ENCOUNTER — Encounter: Payer: Self-pay | Admitting: Cardiovascular Disease

## 2013-09-04 ENCOUNTER — Telehealth: Payer: Self-pay

## 2013-09-04 NOTE — Telephone Encounter (Signed)
Pt called and has concerns about her BP, states she is taking cardiac rehab, and her BP is still elevated, even on medication, her readings over the last several days are 152/100, 158/90, 168/90. States she may need a medication change. Please call.

## 2013-09-04 NOTE — Telephone Encounter (Signed)
Spoke w/ pt.  She reports that her BP has been up before she starts cardiac rehab, which is the only time she has had it checked. Pt reports that she is experiencing considerable pain her neck and shoulder, is sched for MRI on Mon. Instructed pt to monitor BP at home over the weekend and call on Monday if it is elevated.

## 2013-09-07 ENCOUNTER — Telehealth: Payer: Self-pay

## 2013-09-07 ENCOUNTER — Ambulatory Visit: Payer: Self-pay | Admitting: Unknown Physician Specialty

## 2013-09-07 NOTE — Telephone Encounter (Signed)
Pt called to report BP readings over the weekend: 198/102 and 178/98. Reports that she notices it is higher in the morning and drops during the day. Would like to know if she needs a medication change. Pt was scheduled to see Dr. Mariah Milling on 11/20 to establish care.  When i informed her that she missed this appt, she reports that she cancelled this appt and is sched for 10/13/13. Please advise.  Thank you!

## 2013-09-07 NOTE — Telephone Encounter (Signed)
BP readings 198/102, 178/98, pt states it has been this high all weekend. Please call.

## 2013-09-08 NOTE — Telephone Encounter (Signed)
Left message for pt to call back for appt w/ Alinda Money, [place] either today or Friday.

## 2013-09-08 NOTE — Telephone Encounter (Signed)
Would like to make a few changes to her medications and check labwork. Another issue is this shoulder and neck pain while on statin. I'd like to see her in follow-up preferably today or Friday (12/19). Increase Cozaar to 50mg  (two tabs) for now, make sure she takes in AM. If HCTZ medication is still PRN, would change that to scheduled qAM dosing. Would like to switch metoprolol for carvedilol on follow-up and consider switching statins. Will get BMET, LFTs and CK on follow-up.

## 2013-09-09 ENCOUNTER — Ambulatory Visit (INDEPENDENT_AMBULATORY_CARE_PROVIDER_SITE_OTHER): Payer: BC Managed Care – PPO | Admitting: Cardiovascular Disease

## 2013-09-09 ENCOUNTER — Encounter: Payer: Self-pay | Admitting: Cardiovascular Disease

## 2013-09-09 VITALS — BP 150/90 | HR 83 | Ht 66.5 in | Wt 216.0 lb

## 2013-09-09 DIAGNOSIS — E785 Hyperlipidemia, unspecified: Secondary | ICD-10-CM | POA: Insufficient documentation

## 2013-09-09 DIAGNOSIS — I2510 Atherosclerotic heart disease of native coronary artery without angina pectoris: Secondary | ICD-10-CM

## 2013-09-09 DIAGNOSIS — I10 Essential (primary) hypertension: Secondary | ICD-10-CM

## 2013-09-09 MED ORDER — METOPROLOL TARTRATE 25 MG PO TABS: 25.0000 mg | ORAL_TABLET | Freq: Two times a day (BID) | ORAL | Status: DC

## 2013-09-09 MED ORDER — CLOPIDOGREL BISULFATE 75 MG PO TABS: 75.0000 mg | ORAL_TABLET | Freq: Every day | ORAL | Status: DC

## 2013-09-09 MED ORDER — AMLODIPINE BESYLATE 10 MG PO TABS: 10.0000 mg | ORAL_TABLET | Freq: Every day | ORAL | Status: DC

## 2013-09-09 MED ORDER — FUROSEMIDE 20 MG PO TABS: 20.0000 mg | ORAL_TABLET | Freq: Every day | ORAL | Status: DC | PRN

## 2013-09-09 MED ORDER — LOSARTAN POTASSIUM 100 MG PO TABS: 100.0000 mg | ORAL_TABLET | Freq: Every day | ORAL | Status: DC

## 2013-09-09 NOTE — Assessment & Plan Note (Signed)
Encouraged her to stay on her statin 

## 2013-09-09 NOTE — Assessment & Plan Note (Signed)
Blood pressures running high. We have recommended she start amlodipine 5 mg daily. Continue losartan 50 mg daily. She does report significant shortness of breath when supine and we have suggested she try several doses of Lasix one a day for the next several days. Suggested she hold the HCTZ when she takes Lasix, takes citrus products for potassium supplementation. She'll stay on her metoprolol

## 2013-09-09 NOTE — Assessment & Plan Note (Signed)
Currently with no symptoms of angina. No further workup at this time. Continue current medication regimen. 

## 2013-09-09 NOTE — Patient Instructions (Signed)
Please take lasix as needed for shortness of breath Start amlodipine 1/2 pill for high blood prssure Monitor your blood pressure for the next week,  after one week if it continues to run high, you could take a full amlodipine  Take losartan 50 mg daily  (cut the 100 mg pill in 1/2)  Please hold the HCTZ when you take lasix   Please call us if you have new issues that need to be addressed before your next appt.  Your physician wants you to follow-up in: 6 months.  You will receive a reminder letter in the mail two months in advance. If you don't receive a letter, please call our office to schedule the follow-up appointment.

## 2013-09-09 NOTE — Progress Notes (Signed)
Patient ID: Jill Ball, female    DOB: [birth year], 47 y.o.   MRN: [id number]  HPI Comments: Jill Ball is a 47 y.o. African American female w/ PMHx s/f CAD (s/p DES-LCx 06/2013), HTN, HLD and obesity who presents today for follow-up.  She had 90% LCx lesion treated with a DES. She had residual 50% LAD disease. EF > 55%. Coronary vasospasm was noted. She was started on DAPT- ASA/Effient. She continued to have waxing waning presentation of atypical chest pain with visits to the emergency room, negative workup.  In followup today, she reports her blood pressure has been running high. She has severe neck pain radiating down to her left shoulder. Recent MRI to look at her neck by Dr. Hyacinth Meeker. She has been taking extra losartan and restarted HCTZ as blood pressure was running high. She has been participating in cardiac rehabilitation and reports having high blood pressure, no chest pain with exertion, reasonable exercise tolerance. She does report having some shortness of breath when she lays supine. No significant leg edema or abdominal swelling. Repositions in bed, takes a Xanax and able to get to sleep. She reports that she is unable to afford effient and would like a generic. He  Previous 2D echo indicated EF 50-55%, impaired LV diastolic dysfunction, mild LA dilatation, mild MR, mild-mod AI, mild TR, RVSP 40 mmHg and mildly elevated PASP.   EKG: NSR, poor R wave progression, TWIs II, III, aVF, LAD, concave ST elevations I, aVL (unchanged from prior tracings)   Outpatient Encounter Prescriptions as of 09/09/2013  Medication Sig  . ALPRAZolam (XANAX) 0.5 MG tablet Take 1 tablet (0.5 mg total) by mouth at bedtime as needed for sleep.  Marland Kitchen aspirin 81 MG tablet Take 81 mg by mouth daily.  Marland Kitchen atorvastatin (LIPITOR) 20 MG tablet Take 20 mg by mouth daily.  . hydrochlorothiazide (HYDRODIURIL) 25 MG tablet Take 25 mg by mouth daily as needed.   Marland Kitchen KLOR-CON M20 20 MEQ tablet Take 20 mEq by mouth daily.    Marland Kitchen losartan (COZAAR) 100 MG tablet Take 1 tablet (100 mg total) by mouth daily.  . metoprolol tartrate (LOPRESSOR) 25 MG tablet Take 25 mg by mouth 2 (two) times daily.  . nitroGLYCERIN (NITROSTAT) 0.4 MG SL tablet Place 0.4 mg under the tongue every 5 (five) minutes as needed for chest pain.  . [DISCONTINUED] losartan (COZAAR) 25 MG tablet Take 1 tablet (25 mg total) by mouth daily.  . [DISCONTINUED] losartan (COZAAR) 25 MG tablet Take 25 mg by mouth 2 (two) times daily.  . [DISCONTINUED] metoprolol tartrate (LOPRESSOR) 25 MG tablet Take 0.5 tablets (12.5 mg total) by mouth 2 (two) times daily.  . [DISCONTINUED] prasugrel (EFFIENT) 10 MG TABS tablet Take 10 mg by mouth daily.  Marland Kitchen amLODipine (NORVASC) 10 MG tablet Take 1 tablet (10 mg total) by mouth daily.  . clopidogrel (PLAVIX) 75 MG tablet Take 1 tablet (75 mg total) by mouth daily.  . furosemide (LASIX) 20 MG tablet Take 1 tablet (20 mg total) by mouth daily as needed.     Review of Systems  Constitutional: Negative.   HENT: Negative.   Eyes: Negative.   Respiratory: Positive for shortness of breath.   Cardiovascular: Negative.   Gastrointestinal: Negative.   Endocrine: Negative.   Musculoskeletal: Positive for arthralgias and neck pain.  Skin: Negative.   Allergic/Immunologic: Negative.   Neurological: Negative.   Hematological: Negative.   Psychiatric/Behavioral: Negative.   All other systems reviewed and are negative.  BP 150/90  Pulse 83  Ht 5' 6.5" (1.689 m)  Wt 216 lb (97.977 kg)  BMI 34.35 kg/m2  SpO2 98%  Physical Exam  Nursing note and vitals reviewed. Constitutional: She is oriented to person, place, and time. She appears well-developed and well-nourished.  HENT:  Head: Normocephalic.  Nose: Nose normal.  Mouth/Throat: Oropharynx is clear and moist.  Eyes: Conjunctivae are normal. Pupils are equal, round, and reactive to light.  Neck: Normal range of motion. Neck supple. No JVD present.  Cardiovascular:  Normal rate, regular rhythm, normal heart sounds and intact distal pulses.  Exam reveals no gallop and no friction rub.   No murmur heard. Pulmonary/Chest: Effort normal and breath sounds normal. No respiratory distress. She has no wheezes. She has no rales. She exhibits no tenderness.  Abdominal: Soft. Bowel sounds are normal. She exhibits no distension. There is no tenderness.  Musculoskeletal: Normal range of motion. She exhibits no edema and no tenderness.  Lymphadenopathy:    She has no cervical adenopathy.  Neurological: She is alert and oriented to person, place, and time. Coordination normal.  Skin: Skin is warm and dry. No rash noted. No erythema.  Psychiatric: She has a normal mood and affect. Her behavior is normal. Judgment and thought content normal.    Assessment and Plan

## 2013-09-18 ENCOUNTER — Emergency Department: Payer: Self-pay | Admitting: Emergency Medicine

## 2013-09-18 LAB — BASIC METABOLIC PANEL
Anion Gap: 4 — ABNORMAL LOW (ref 7–16)
Calcium, Total: 9.1 mg/dL (ref 8.5–10.1)
Chloride: 108 mmol/L — ABNORMAL HIGH (ref 98–107)
Co2: 27 mmol/L (ref 21–32)
Creatinine: 1.13 mg/dL (ref 0.60–1.30)
EGFR (Non-African Amer.): 58 — ABNORMAL LOW
Osmolality: 279 (ref 275–301)
Potassium: 3.6 mmol/L (ref 3.5–5.1)
Sodium: 139 mmol/L (ref 136–145)

## 2013-09-18 LAB — CBC
MCH: 27.7 pg (ref 26.0–34.0)
MCHC: 32.9 g/dL (ref 32.0–36.0)
RBC: 4.18 10*6/uL (ref 3.80–5.20)
RDW: 15 % — ABNORMAL HIGH (ref 11.5–14.5)
WBC: 7.9 10*3/uL (ref 3.6–11.0)

## 2013-09-19 LAB — CK TOTAL AND CKMB (NOT AT ARMC)
CK, Total: 379 U/L — ABNORMAL HIGH (ref 21–215)
CK-MB: 0.7 ng/mL (ref 0.5–3.6)

## 2013-09-19 LAB — PRO B NATRIURETIC PEPTIDE: B-Type Natriuretic Peptide: 22 pg/mL (ref 0–125)

## 2013-09-24 ENCOUNTER — Encounter: Payer: Self-pay | Admitting: Cardiovascular Disease

## 2013-10-06 ENCOUNTER — Other Ambulatory Visit: Payer: Self-pay | Admitting: Cardiovascular Disease

## 2013-10-13 ENCOUNTER — Ambulatory Visit: Payer: Self-pay | Admitting: Cardiovascular Disease

## 2013-12-07 ENCOUNTER — Ambulatory Visit (INDEPENDENT_AMBULATORY_CARE_PROVIDER_SITE_OTHER): Payer: BC Managed Care – PPO | Admitting: Cardiovascular Disease

## 2013-12-07 ENCOUNTER — Encounter: Payer: Self-pay | Admitting: Cardiovascular Disease

## 2013-12-07 VITALS — BP 102/70 | HR 64 | Ht 66.5 in | Wt 215.0 lb

## 2013-12-07 DIAGNOSIS — E669 Obesity, unspecified: Secondary | ICD-10-CM

## 2013-12-07 DIAGNOSIS — I2510 Atherosclerotic heart disease of native coronary artery without angina pectoris: Secondary | ICD-10-CM

## 2013-12-07 DIAGNOSIS — E785 Hyperlipidemia, unspecified: Secondary | ICD-10-CM

## 2013-12-07 DIAGNOSIS — I10 Essential (primary) hypertension: Secondary | ICD-10-CM

## 2013-12-07 DIAGNOSIS — R0602 Shortness of breath: Secondary | ICD-10-CM

## 2013-12-07 MED ORDER — ATORVASTATIN CALCIUM 20 MG PO TABS: 20.0000 mg | ORAL_TABLET | Freq: Every day | ORAL | Status: DC

## 2013-12-07 NOTE — Patient Instructions (Signed)
You are doing well. Please restart atorvastatin one a day, evening (cholesterol_  Please call us if you have new issues that need to be addressed before your next appt.  Your physician wants you to follow-up in: 6 months.  You will receive a reminder letter in the mail two months in advance. If you don't receive a letter, please call our office to schedule the follow-up appointment.

## 2013-12-07 NOTE — Assessment & Plan Note (Signed)
Currently with no symptoms of angina. No further workup at this time. Continue current medication regimen. 

## 2013-12-07 NOTE — Assessment & Plan Note (Signed)
For some reason she's not taking her Lipitor. Goal LDL less than 70. We have recommended she restart generic Lipitor 20 mg daily with cholesterol check in several months time

## 2013-12-07 NOTE — Progress Notes (Signed)
Patient ID: Jill Ball, female    DOB: Dec 15, 1965, 48 y.o.   MRN: [id number]  HPI Comments: Jill Ball is a 48 y.o. African American female w/ PMHx s/f CAD (s/p DES-LCx 06/2013), HTN, HLD and obesity who presents today for follow-up.  She had 90% LCx lesion treated with a DES. She had residual 50% LAD disease. EF > 55%. Coronary vasospasm was noted. She was started on DAPT- ASA/Effient. She initially continued to have waxing waning presentation of atypical chest pain with visits to the emergency room, negative workup. Since then symptoms of chest discomfort have resolved  She does report having numbness in her hands and feet at times, a pulsating feeling in her ears when she climbs the stairs, occasional shortness of breath with heavy exertion.   Previously had severe neck pain radiating down to her left shoulder. Had an MRI to look at her neck .  She reports blood pressure has been high in the morning, better later in the day  no chest pain with exertion, reasonable exercise tolerance. She does report having some shortness of breath when she lays supine.  No significant leg edema or abdominal swelling.  She stopped her cholesterol pill on her own for uncertain reasons  Previous 2D echo indicated EF 50-55%, impaired LV diastolic dysfunction, mild LA dilatation, mild MR, mild-mod AI, mild TR, RVSP 40 mmHg and mildly elevated PASP.  No recent cholesterol panel  EKG: NSR, poor R wave progression, TWIs II, III, aVF, LAD, concave ST elevations I, aVL (unchanged from prior tracings)   Outpatient Encounter Prescriptions as of 12/07/2013  Medication Sig  . ALPRAZolam (XANAX) 0.5 MG tablet Take 1 tablet (0.5 mg total) by mouth at bedtime as needed for sleep.  Marland Kitchen amLODipine (NORVASC) 10 MG tablet Take 1 tablet (10 mg total) by mouth daily.  Marland Kitchen aspirin 81 MG tablet Take 81 mg by mouth daily.  . clopidogrel (PLAVIX) 75 MG tablet Take 1 tablet (75 mg total) by mouth daily.  . cyclobenzaprine  (FLEXERIL) 10 MG tablet Take 10 mg by mouth as needed.   . furosemide (LASIX) 20 MG tablet Take 1 tablet (20 mg total) by mouth daily as needed.  . hydrochlorothiazide (HYDRODIURIL) 25 MG tablet Take 25 mg by mouth daily as needed.   Marland Kitchen KLOR-CON M20 20 MEQ tablet Take 20 mEq by mouth daily.   Marland Kitchen losartan (COZAAR) 100 MG tablet Take 1 tablet (100 mg total) by mouth daily.  . metoprolol tartrate (LOPRESSOR) 25 MG tablet Take 1 tablet (25 mg total) by mouth 2 (two) times daily.  Marland Kitchen NITROSTAT 0.4 MG SL tablet PLACE 1 TABLET UNDER TONGUE EVERY 5 MINS AS NEEDED FOR CHEST PAIN AND 3 DOSES  . [DISCONTINUED] atorvastatin (LIPITOR) 20 MG tablet Take 20 mg by mouth daily.     Review of Systems  Constitutional: Negative.   HENT: Negative.   Eyes: Negative.   Respiratory: Positive for shortness of breath.   Cardiovascular: Negative.   Gastrointestinal: Negative.   Endocrine: Negative.   Musculoskeletal: Positive for arthralgias and neck pain.  Skin: Negative.   Allergic/Immunologic: Negative.   Neurological: Negative.   Hematological: Negative.   Psychiatric/Behavioral: Negative.   All other systems reviewed and are negative.    BP 102/70  Pulse 64  Ht 5' 6.5" (1.689 m)  Wt 215 lb (97.523 kg)  BMI 34.19 kg/m2 Repeat blood pressure was 115 systolic Physical Exam  Nursing note and vitals reviewed. Constitutional: She is oriented to person, place,  and time. She appears well-developed and well-nourished.  HENT:  Head: Normocephalic.  Nose: Nose normal.  Mouth/Throat: Oropharynx is clear and moist.  Eyes: Conjunctivae are normal. Pupils are equal, round, and reactive to light.  Neck: Normal range of motion. Neck supple. No JVD present.  Cardiovascular: Normal rate, regular rhythm, normal heart sounds and intact distal pulses.  Exam reveals no gallop and no friction rub.   No murmur heard. Pulmonary/Chest: Effort normal and breath sounds normal. No respiratory distress. She has no wheezes.  She has no rales. She exhibits no tenderness.  Abdominal: Soft. Bowel sounds are normal. She exhibits no distension. There is no tenderness.  Musculoskeletal: Normal range of motion. She exhibits no edema and no tenderness.  Lymphadenopathy:    She has no cervical adenopathy.  Neurological: She is alert and oriented to person, place, and time. Coordination normal.  Skin: Skin is warm and dry. No rash noted. No erythema.  Psychiatric: She has a normal mood and affect. Her behavior is normal. Judgment and thought content normal.    Assessment and Plan

## 2013-12-07 NOTE — Assessment & Plan Note (Addendum)
Atypical shortness of breath, only on in bed and with minimal exertion. No significant symptoms with exercise or exertion. As symptoms are very atypical, no further workup at this time. Recommend she restart her exercise program. She denies having any symptoms when she was exercising in the past. If symptoms do present with exertion or exercise, additional testing could be done

## 2013-12-07 NOTE — Assessment & Plan Note (Signed)
She reports having elevated blood pressure the morning. We have suggested she could try taking the full losartan 100 mg in the evening, continue her other medications in the morning

## 2013-12-07 NOTE — Assessment & Plan Note (Signed)
We have encouraged continued exercise, careful diet management in an effort to lose weight. 

## 2013-12-15 NOTE — Progress Notes (Signed)
This encounter was created in error - please disregard.

## 2013-12-16 ENCOUNTER — Ambulatory Visit: Payer: Self-pay | Admitting: Cardiovascular Disease

## 2014-02-17 ENCOUNTER — Other Ambulatory Visit: Payer: Self-pay

## 2014-02-17 MED ORDER — METOPROLOL TARTRATE 25 MG PO TABS: 25.0000 mg | ORAL_TABLET | Freq: Two times a day (BID) | ORAL | Status: DC

## 2014-02-17 MED ORDER — CLOPIDOGREL BISULFATE 75 MG PO TABS: 75.0000 mg | ORAL_TABLET | Freq: Every day | ORAL | Status: DC

## 2014-04-12 ENCOUNTER — Emergency Department: Payer: Self-pay | Admitting: Emergency Medicine

## 2014-04-12 LAB — URINALYSIS, COMPLETE
Bacteria: NONE SEEN
RBC,UR: 3 /HPF (ref 0–5)
SPECIFIC GRAVITY: 1.028 (ref 1.003–1.030)
Squamous Epithelial: 1

## 2014-04-15 LAB — URINE CULTURE

## 2014-07-06 ENCOUNTER — Telehealth: Payer: Self-pay

## 2014-07-06 NOTE — Telephone Encounter (Signed)
Pt would like to know if it is ok for her to take the Flu mist today at her place of employment. Please call and advise

## 2014-07-06 NOTE — Telephone Encounter (Signed)
Left message for pt that this is okay.  Asked her to call back w/ any questions or concerns.

## 2014-07-15 ENCOUNTER — Ambulatory Visit: Payer: BC Managed Care – PPO | Admitting: Cardiovascular Disease

## 2014-07-20 ENCOUNTER — Other Ambulatory Visit: Payer: Self-pay | Admitting: Cardiovascular Disease

## 2014-07-21 ENCOUNTER — Ambulatory Visit: Payer: BC Managed Care – PPO | Admitting: Cardiovascular Disease

## 2014-07-26 ENCOUNTER — Ambulatory Visit: Payer: BC Managed Care – PPO | Admitting: Cardiovascular Disease

## 2014-07-28 ENCOUNTER — Encounter: Payer: Self-pay | Admitting: Cardiovascular Disease

## 2014-07-28 ENCOUNTER — Ambulatory Visit (INDEPENDENT_AMBULATORY_CARE_PROVIDER_SITE_OTHER): Payer: BC Managed Care – PPO | Admitting: Cardiovascular Disease

## 2014-07-28 VITALS — BP 102/80 | HR 65 | Ht 66.5 in | Wt 227.8 lb

## 2014-07-28 DIAGNOSIS — I2510 Atherosclerotic heart disease of native coronary artery without angina pectoris: Secondary | ICD-10-CM

## 2014-07-28 DIAGNOSIS — E785 Hyperlipidemia, unspecified: Secondary | ICD-10-CM

## 2014-07-28 DIAGNOSIS — R0602 Shortness of breath: Secondary | ICD-10-CM

## 2014-07-28 DIAGNOSIS — E669 Obesity, unspecified: Secondary | ICD-10-CM

## 2014-07-28 DIAGNOSIS — I10 Essential (primary) hypertension: Secondary | ICD-10-CM

## 2014-07-28 NOTE — Assessment & Plan Note (Signed)
Blood pressure is well controlled on today's visit. No changes made to the medications. 

## 2014-07-28 NOTE — Assessment & Plan Note (Signed)
We have encouraged continued exercise, careful diet management in an effort to lose weight. 

## 2014-07-28 NOTE — Assessment & Plan Note (Signed)
Cholesterol slightly above goal. Recommended weight loss, exercise, repeat cholesterol in 6 months time. If numbers continue to run high, will need to increase her Lipitor dose

## 2014-07-28 NOTE — Assessment & Plan Note (Signed)
Currently with no symptoms of angina. No further workup at this time. Continue current medication regimen. 

## 2014-07-28 NOTE — Assessment & Plan Note (Signed)
I suspect her mild shortness of breath is secondary to obesity and deconditioning. Recommend a regular exercise program

## 2014-07-28 NOTE — Progress Notes (Signed)
Patient ID: Jill Ball, female    DOB: 12/17/1965, 48 y.o.   MRN: [id number]  HPI Comments: Jill BachDonna R Ball is a 48 y.o. African American female w/ PMHx s/f CAD (s/p DES-LCx 06/2013), HTN, HLD and obesity who presents today for follow-up.  She had 90% LCx lesion treated with a DES. She had residual 50% LAD disease. EF > 55%. Coronary vasospasm was noted. She was started on DAPT- ASA/Effient. She initially continued to have waxing waning presentation of atypical chest pain with visits to the emergency room, negative workup.  In follow-up today, she reports that she is doing relatively well. She's not exercising on a regular basis. Weight continues to be an issue but she is trying to eat better. no chest pain with exertion, reasonable exercise tolerance. Occasionally has shortness of breath No significant leg edema or abdominal swelling.  Most recent lab work was reviewed,showing total cholesterol 157, LDL 82, hemoglobin A1c 5.9 from June 2015  Previously reportednumbness in her hands and feet at times, a pulsating feeling in her ears when she climbs the stairs, occasional shortness of breath with heavy exertion.  Also previously hadsevere neck pain radiating down to her left shoulder. Had an MRI to look at her neck .   Previous 2D echo indicated EF 50-55%, impaired LV diastolic dysfunction, mild LA dilatation, mild MR, mild-mod AI, mild TR, RVSP 40 mmHg and mildly elevated PASP.  No recent cholesterol panel  EKG from today's office visit: NSR, rate 65 bpm, nonspecific T-wave abnormality  Outpatient Encounter Prescriptions as of 07/28/2014  Medication Sig  . amLODipine (NORVASC) 10 MG tablet Take 1 tablet (10 mg total) by mouth daily.  Marland Kitchen. aspirin 81 MG tablet Take 81 mg by mouth daily.  Marland Kitchen. atorvastatin (LIPITOR) 20 MG tablet Take 1 tablet (20 mg total) by mouth daily.  . clopidogrel (PLAVIX) 75 MG tablet TAKE ONE TABLET BY MOUTH ONCE DAILY  . hydrochlorothiazide (HYDRODIURIL) 25 MG tablet Take  25 mg by mouth daily.   Marland Kitchen. losartan (COZAAR) 100 MG tablet Take 1 tablet (100 mg total) by mouth daily.  . metoprolol tartrate (LOPRESSOR) 25 MG tablet Take 1 tablet (25 mg total) by mouth 2 (two) times daily.  Marland Kitchen. NITROSTAT 0.4 MG SL tablet PLACE 1 TABLET UNDER TONGUE EVERY 5 MINS AS NEEDED FOR CHEST PAIN AND 3 DOSES   Social history  reports that she has never smoked. She does not have any smokeless tobacco history on file. She reports that she drinks alcohol. She reports that she does not use illicit drugs.  Review of Systems  Constitutional: Negative.   Eyes: Negative.   Respiratory: Positive for shortness of breath.   Cardiovascular: Negative.   Gastrointestinal: Negative.   Endocrine: Negative.   Musculoskeletal: Positive for arthralgias and neck pain.  Skin: Negative.   Neurological: Negative.   All other systems reviewed and are negative.   BP 102/80 mmHg  Pulse 65  Ht 5' 6.5" (1.689 m)  Wt 227 lb 12 oz (103.307 kg)  BMI 36.21 kg/m2  Physical Exam  Constitutional: She is oriented to person, place, and time. She appears well-developed and well-nourished.  HENT:  Head: Normocephalic.  Nose: Nose normal.  Mouth/Throat: Oropharynx is clear and moist.  Eyes: Conjunctivae are normal. Pupils are equal, round, and reactive to light.  Neck: Normal range of motion. Neck supple. No JVD present.  Cardiovascular: Normal rate, regular rhythm, S1 normal, S2 normal, normal heart sounds and intact distal pulses.  Exam reveals no  gallop and no friction rub.   No murmur heard. Pulmonary/Chest: Effort normal and breath sounds normal. No respiratory distress. She has no wheezes. She has no rales. She exhibits no tenderness.  Abdominal: Soft. Bowel sounds are normal. She exhibits no distension. There is no tenderness.  Musculoskeletal: Normal range of motion. She exhibits no edema or tenderness.  Lymphadenopathy:    She has no cervical adenopathy.  Neurological: She is alert and oriented to  person, place, and time. Coordination normal.  Skin: Skin is warm and dry. No rash noted. No erythema.  Psychiatric: She has a normal mood and affect. Her behavior is normal. Judgment and thought content normal.    Assessment and Plan  Nursing note and vitals reviewed.

## 2014-07-28 NOTE — Patient Instructions (Addendum)
You are doing well. No medication changes were made.  Please call us if you have new issues that need to be addressed before your next appt.  Your physician wants you to follow-up in: 12 months.  You will receive a reminder letter in the mail two months in advance. If you don't receive a letter, please call our office to schedule the follow-up appointment.  Your next appointment will be scheduled in our new office located at :  [facility]  [address], [address]  [place], [place] [address]  

## 2014-10-04 ENCOUNTER — Other Ambulatory Visit: Payer: Self-pay | Admitting: Cardiovascular Disease

## 2014-11-24 ENCOUNTER — Other Ambulatory Visit: Payer: Self-pay | Admitting: *Deleted

## 2014-11-24 ENCOUNTER — Telehealth: Payer: Self-pay | Admitting: Cardiovascular Disease

## 2014-11-24 MED ORDER — METOPROLOL TARTRATE 25 MG PO TABS: 25.0000 mg | ORAL_TABLET | Freq: Two times a day (BID) | ORAL | Status: DC

## 2014-11-24 MED ORDER — HYDROCHLOROTHIAZIDE 25 MG PO TABS: 25.0000 mg | ORAL_TABLET | Freq: Every day | ORAL | Status: DC

## 2014-11-24 MED ORDER — ATORVASTATIN CALCIUM 20 MG PO TABS: 20.0000 mg | ORAL_TABLET | Freq: Every day | ORAL | Status: DC

## 2014-11-24 MED ORDER — LOSARTAN POTASSIUM 100 MG PO TABS: 100.0000 mg | ORAL_TABLET | Freq: Every day | ORAL | Status: DC

## 2014-11-24 MED ORDER — CLOPIDOGREL BISULFATE 75 MG PO TABS: 75.0000 mg | ORAL_TABLET | Freq: Every day | ORAL | Status: DC

## 2014-11-24 MED ORDER — AMLODIPINE BESYLATE 10 MG PO TABS: 10.0000 mg | ORAL_TABLET | Freq: Every day | ORAL | Status: DC

## 2014-11-24 NOTE — Telephone Encounter (Signed)
Pt also calling stating she needs a refill  1. Which medications need to be refilled? All heart meds   2. Which pharmacy is medication to be sent to? [facility] [address]  3. Do they need a 30 day or 90 day supply? 30 day would be ideal but it depends on the doctor  4. Would they like a call back once the medication has been sent to the pharmacy? Yes, she is trying to get them all done at one time.

## 2014-11-24 NOTE — Telephone Encounter (Signed)
Rx has been sent to local pharmacy for all cardiac medications.

## 2014-12-14 ENCOUNTER — Other Ambulatory Visit: Payer: Self-pay

## 2014-12-14 DIAGNOSIS — Z1231 Encounter for screening mammogram for malignant neoplasm of breast: Secondary | ICD-10-CM

## 2014-12-20 ENCOUNTER — Ambulatory Visit: Payer: Self-pay

## 2014-12-31 ENCOUNTER — Encounter (INDEPENDENT_AMBULATORY_CARE_PROVIDER_SITE_OTHER): Payer: Self-pay

## 2014-12-31 ENCOUNTER — Ambulatory Visit
Admission: RE | Admit: 2014-12-31 | Discharge: 2014-12-31 | Disposition: A | Discharge status: Home / Self Care | Payer: BLUE CROSS/BLUE SHIELD | Source: Ambulatory Visit

## 2014-12-31 DIAGNOSIS — Z1231 Encounter for screening mammogram for malignant neoplasm of breast: Secondary | ICD-10-CM

## 2015-01-04 ENCOUNTER — Other Ambulatory Visit: Payer: Self-pay | Admitting: Family Medicine

## 2015-01-04 DIAGNOSIS — R928 Other abnormal and inconclusive findings on diagnostic imaging of breast: Secondary | ICD-10-CM

## 2015-01-12 ENCOUNTER — Ambulatory Visit
Admission: RE | Admit: 2015-01-12 | Discharge: 2015-01-12 | Disposition: A | Discharge status: Home / Self Care | Payer: BLUE CROSS/BLUE SHIELD | Source: Ambulatory Visit | Attending: Family Medicine | Admitting: Family Medicine

## 2015-01-12 DIAGNOSIS — R928 Other abnormal and inconclusive findings on diagnostic imaging of breast: Secondary | ICD-10-CM

## 2015-01-14 NOTE — Discharge Summary (Signed)
PATIENT NAME:  Jill Ball, Charnel MR#:  [id number] DATE OF BIRTH:  16-Dec-1965  DATE OF ADMISSION:  07/07/2013 DATE OF DISCHARGE:  07/08/2013  ADMITTING DIAGNOSIS: Chest pain.   DISCHARGE DIAGNOSES:  1.  Chest pain due to non-ST myocardial infarction, status post stent to the distal circumflex, status post drug-eluting stent placement and also moderate left anterior descending disease.  2.  Syncope with collapse, possibly related to her non-ST myocardial infarction.  3.  Hypertension, which was accelerated during hospitalization.   CONSULTANTS: Dr. Mariah MillingGollan.  PROCEDURES: Cath with findings of moderate to severe mid to distal left circumflex disease felt to be 90% at the time of stenosis, mild LAD disease 50% stenosis which was diffuse. Admitting glucose 96, BUN 16, creatinine 0.96, sodium 138, potassium 3.6, chloride 104, CO2 was 27, calcium 9.0. Cholesterol 167, triglycerides 94, HDL 74. Beta-hCG less than 1. Troponin 6.80, 5.604 and 3.4, CK-MB 11.6, 7.1 and 3.0. WBC 9.8, hemoglobin 11.7, platelet count 229.  CT scan of the head showed no acute abnormality. CT spine without any injuries. Echocardiogram of the heart showed no significant valvular disease, ejection fraction 50% to 55% with impaired left ventricular diastolic filling defect, mildly dilated left atrium, moderate and no significant valvular disease. There was mild to moderate aortic regurg, mild tricuspid regurg, RVSP estimated at 40 and mildly elevated pulmonary artery systolic pressure.   HOSPITAL COURSE: Please refer to H and P done by the admitting physician. The patient is a [age] year old African American female with history of hypertension, who presented with syncope x 4 and burning substernally in the chest. The patient was seen in the ED and had EKG that showed inverted T waves in the inferior leads and had a troponin that was elevated at 6.8. She was admitted for non-ST myocardial infarction and started on IV heparin and beta blockers. Lipid  lowering medications. Antiplatelet therapy. She was seen by cardiology and underwent cardiac cath. Cath revealed diffuse LAD disease as well as a significant left circumflex disease. The circumflex was stented. The patient tolerated the procedure without any complications. She is doing much better and is stable for discharge. She was seen by cardiology. She does not have any chest discomfort.   DISCHARGE MEDICATIONS: Losartan 25 mg daily, hydrochlorothiazide 25 mg daily, nitroglycerin 0.4 mg sublingual p.r.n., aspirin 81 mg 1 tab p.o. daily, prasugrel 10 mg daily, metoprolol 12.5 p.o. q.12 hours and Lipitor 20 mg at bedtime.   DIET: Low sodium, low fat, low cholesterol.   ACTIVITY: As tolerated.   FOLLOWUP: With Dr. Mariah MillingGollan in 1 to 2 weeks. Follow with primary M.D. in 2 to 4 weeks. The patient will need further evaluation for the elevated pulmonary hypertension and may need a sleep study. The patient to keep a log of blood pressure to take to primary M.D.   NOTE: 35 minutes spent on the discharge.   ____________________________ Lacie ScottsShreyang H. Allena KatzPatel, MD shp:aw D: 07/09/2013 08:13:02 ET T: 07/09/2013 09:05:27 ET JOB#: [id number]  cc: Sidney Kann H. Allena KatzPatel, MD, <Dictator> Charise CarwinSHREYANG H Samona Chihuahua MD ELECTRONICALLY SIGNED 07/18/2013 10:24

## 2015-01-14 NOTE — Consult Note (Signed)
General Aspect 49 year old female with significant past medical history of hypertension who presents with complaint of near syncope and recent chest burning. Cardiology was consulted for NSTEMI.  She  Reports she had near syncope yesterday morning. At 6:30 Am, she walked to the bathroom and she had severe lightheadedness, "everything went black". NO LOC. She had four epsiodes, recovered, went back to the bed.  She layed on the bed until she recovered, felt better, then went to work and worked a full day. She did not feel well at the end of the day and she presented to the ER.    She describes some occasional episodes of fluttering and epigastric indigestion which resolved when she took indigestion medication. She denies any chest pain, but she reports chronic left shoulder pain, as well as reports some mild shortness of breath, dizziness, lightheadedness. The patient as well reports pain in the right arm before 2 nights.  In the ER,  the patient's CT head and CT cervical spine did not show any acute findings. The patient's troponin came back elevated at 6.8 and her EKG showing inverted T waves in leads III and aVF. The patient was given aspirin and subcutaneous Lovenox in the ED for non-ST elevated MI.   Present Illness .  PAST MEDICAL HISTORY: Hypertension.   PAST SURGICAL HISTORY: None.   ALLERGIES: No known drug allergies.   HOME MEDICATIONS:  1. Losartan 25 mg oral daily.  2. Hydrochlorothiazide 25 mg oral daily.   SOCIAL HISTORY: The patient works as a Psychologist, sport and exercise. She denies any smoking. Drinks alcohol occasionally. No illicit drug use.   FAMILY HISTORY: Denies any coronary artery disease at young age. Reports her grandmother had history of heart attack but not at a young age. History of CVA/TIA in her father.   Lab Results:  Routine Chem:  13-Oct-14 21:03   Result Comment TROPONIN - RESULTS VERIFIED BY REPEAT TESTING.  - C/ AMY COYNA @2233  07-06-13 BY AJO  - READ-BACK  PROCESS PERFORMED.  Result(s) reported on 06 Jul 2013 at 10:36PM.  Glucose, Serum 96  BUN 16  Creatinine (comp) 0.96  Sodium, Serum 138  Potassium, Serum 3.6  Chloride, Serum 104  CO2, Serum 27  Calcium (Total), Serum 9.0  Anion Gap 7  Osmolality (calc) 277  eGFR (African American) >60  eGFR (Non-African American) >60 (eGFR values <17m/min/1.73 m2 may be an indication of chronic kidney disease (CKD). Calculated eGFR is useful in patients with stable renal function. The eGFR calculation will not be reliable in acutely ill patients when serum creatinine is changing rapidly. It is not useful in  patients on dialysis. The eGFR calculation may not be applicable to patients at the low and high extremes of body sizes, pregnant women, and vegetarians.)  Cardiac:  13-Oct-14 21:03   Troponin I  6.80 (0.00-0.05 0.05 ng/mL or less: NEGATIVE  Repeat testing in 3-6 hrs  if clinically indicated. >0.05 ng/mL: POTENTIAL  MYOCARDIAL INJURY. Repeat  testing in 3-6 hrs if  clinically indicated. NOTE: An increase or decrease  of 30% or more on serial  testing suggests a  clinically important change)  Routine Hem:  13-Oct-14 21:03   WBC (CBC) 9.8  RBC (CBC) 4.11  Hemoglobin (CBC)  11.7  Hematocrit (CBC)  34.7  Platelet Count (CBC) 229 (Result(s) reported on 06 Jul 2013 at 09:33PM.)  MCV 84  MCH 28.4  MCHC 33.7  RDW  15.0   EKG:  Interpretation EKG shows NSR with t wave inversions  in leads III, AVF, poor r wave progression through the anterior precordial leads   Radiology Results:  XRay:    14-Oct-14 00:00, Chest PA and Lateral  Chest PA and Lateral   REASON FOR EXAM:    dyspnea  COMMENTS:       PROCEDURE: DXR - DXR CHEST PA (OR AP) AND LATERAL  - Jul 07 2013 12:00AM     RESULT: The lungs are well-expanded and clear. The cardiac silhouette is   normal in size. The mediastinum is normal in width. There is no pleural   effusion or pneumothorax. There is mild tortuosity of the  descending   thoracic aorta. The observed portions of the bony thorax appear normal.    IMPRESSION:  There is no evidence of acute cardiopulmonary abnormality.     Dictation Site: 2      Verified By: DAVID A. Martinique, M.D., MD    No Known Allergies:   Vital Signs/Nurse's Notes:  **Vital Signs.:   14-Oct-14 09:28  Vital Signs Type Pre Medication  Temperature Temperature (F) 98.3  Celsius 36.8  Pulse Pulse 67  Respirations Respirations 16  Systolic BP Systolic BP 331  Diastolic BP (mmHg) Diastolic BP (mmHg) 89  Mean BP 114  Pulse Ox % Pulse Ox % 100  Pulse Ox Activity Level  At rest  Oxygen Delivery Room Air/ 21 %    Impression 49 year old female with significant past medical history of hypertension who presents with complaint of near syncope and recent chest burning. Cardiology was consulted for NSTEMI.  1) NSTEMI ABN EKG, chest burning over the past few weeks, near syncope Concerning for underlying severe CAD, possibly RCA --Troponin elevated, >6 --Discussed with patient, she is will to proceed with cardiac cath today --Will hold lovenox continue asa, b-blocker, statin  2)HTN: will hold HCTZ prior to cath continue other meds.   Electronic Signatures: Ida Rogue (MD)  (Signed 14-Oct-14 09:53)  Authored: General Aspect/Present Illness, Home Medications, Labs, EKG , Radiology, Allergies, Vital Signs/Nurse's Notes, Impression/Plan   Last Updated: 14-Oct-14 09:53 by Ida Rogue (MD)

## 2015-01-14 NOTE — Discharge Summary (Signed)
PATIENT NAME:  Jill Ball, Jill Ball MR#:  [id number] DATE OF BIRTH:  June 09, 1966  DATE OF ADMISSION:  07/09/2013 DATE OF DISCHARGE:  07/10/2013  ADMITTING PHYSICIAN: Krystal EatonShayiq Ahmadzia, MD  DISCHARGING PHYSICIAN: Enid Baasadhika Cesar Alf, MD  PRIMARY PHYSICIAN: Nigel BridgemanSyed Asad Ali Shah, MD  PRIMARY CARDIOLOGIST: Antonieta Ibaimothy J. Gollan, MD  CONSULTATIONS IN THE HOSPITAL: Cardiology consultation by Antonieta Ibaimothy J. Gollan, MD.   DISCHARGE DIAGNOSES:  1. Chest pain, likely musculoskeletal in nature. 2. Coronary artery disease status post left circumflex artery stent placement on 07/07/2013 3. Hypertension.   DISCHARGE HOME MEDICATIONS:  1. Losartan 25 mg p.o. daily.  2. Hydrochlorothiazide 25 mg p.o. daily.  3. Nitroglycerin sublingual 0.4 mg every 5 minutes p.r.n. for chest pain.  4. Aspirin 81 mg p.o. daily.  5. Effient 10 mg p.o. daily.  6. Metoprolol 12.5 mg p.o. b.i.d.  7. Lipitor 20 mg p.o. daily.  8. Flexeril 10 mg p.o. q.8 hours p.r.n. for muscle spasms.  9. Vicodin 5/325 mg 1 tablet every 6 hours as needed for pain.  10. Potassium chloride 20 mEq p.o. every other day.   DISCHARGE DIET: Low-sodium, low-cholesterol diet.   DISCHARGE ACTIVITY: As tolerated.    FOLLOWUP INSTRUCTIONS: Follow up with Dr. Mariah MillingGollan in 2 weeks.   LABORATORY AND IMAGING STUDIES:  Sodium 141, potassium 3.3, chloride 107, bicarbonate 30, BUN 12, creatinine 0.91, glucose 106 and calcium of 8.5.  CK 89, CK-MB 0.5, troponin 0.71.  WBC 7.8, hemoglobin 10.7, hematocrit 31.0, platelet count 230.  Troponin was as high as 1.4 on admission.   BRIEF HOSPITAL COURSE: Jill Ball is a 49 year old African-American female with past medical history significant for hypertension, who was just admitted to the hospital on 07/07/2013 for non-ST segment elevation MI, seen by cardiologist and had a cardiac catheterization done, which revealed 90% left circumflex blockage for which a drug-eluting stent was placed, and the patient was discharged on 07/08/2013.  The patient was stable at the time of discharge; however, the next day, she came back to the Emergency Room complaining of chest pain radiating to the neck and also to the arm.   1. Chest pain after a recent stent placement. Initially, she was placed on heparin drip and nitro drip, thinking that it would be recurrent angina versus stent occlusion; however, her troponins trended down. On physical exam, she had more muscle wall tenderness to palpation, and it was more muscle spasms in the neck that were going down the arm. She did not get relief with the nitroglycerin drip; however, when a dose of naproxen and Flexeril was given together, the patient felt her pain has relieved, and she wanted to go home. Since her troponins trended down, cardiology recommended outpatient followup. She was discharged on the same cardiac medications, including aspirin, Effient, statin, metoprolol and losartan.  2. Hypertension. The patient was on losartan and HCTZ, which were continued; however, her potassium was low in the last hospital admission and also this admission, so she is being discharged on potassium supplements while she is on HCTZ.  The patient is also on metoprolol for her hypertension.  Her course has been otherwise uneventful in the hospital.   DISCHARGE CONDITION: Stable.   DISCHARGE DISPOSITION: Home.   TIME SPENT ON DISCHARGE: 40 minutes.   ____________________________ Enid Baasadhika Vaibhav Fogleman, MD rk:lb D: 07/11/2013 11:26:40 ET T: 07/11/2013 12:18:25 ET JOB#: [id number]  cc: Enid Baasadhika Aidyn Sportsman, MD, <Dictator> [facility] Asad Hilario QuarryAli Shah, MD Antonieta Ibaimothy J. Gollan, MD Enid BaasADHIKA Alaycia Eardley MD ELECTRONICALLY SIGNED 07/14/2013 12:51

## 2015-01-14 NOTE — H&P (Signed)
PATIENT NAME:  Jill Ball, Jill Ball MR#:  [id number] DATE OF BIRTH:  02/09/1966  DATE OF ADMISSION:  07/07/2013  REFERRING PHYSICIAN: Dr. Revonda StandardAllison.   PRIMARY CARE PHYSICIAN: Dr. Brayton ElSyed Shah.   CHIEF COMPLAINT: Syncope.   HISTORY OF PRESENT ILLNESS: This is a 49 year old female with significant past medical history of hypertension who presents with complaint of syncope. Reports she had syncope this morning. While in the bathroom, reports she fell, where she fell on her right side. The patient's CT head and CT cervical spine did not show any acute findings. The patient reports she had minimal loss of consciousness, but she felt so weak. She went back to work, but she reports she did not feel right all day. She describes some occasional episodes of fluttering and epigastric indigestion which resolved when she took indigestion medication. She denies any chest pain, but she reports chronic left shoulder pain, as well as reports some mild shortness of breath, dizziness, lightheadedness. The patient as well reports pain in the right arm before 2 nights. The patient's troponin came back elevated at 6.8 and her EKG showing inverted T waves in leads III and aVF. The patient was given aspirin and subcutaneous Lovenox in the ED for non-ST elevated MI. Hospitalist service was requested to admit the patient for further treatment of her non-STEMI.   PAST MEDICAL HISTORY: Hypertension.   PAST SURGICAL HISTORY: None.   ALLERGIES: No known drug allergies.   HOME MEDICATIONS:  1. Losartan 25 mg oral daily.  2. Hydrochlorothiazide 25 mg oral daily.   SOCIAL HISTORY: The patient works as a Engineer, sitemedical assistant. She denies any smoking. Drinks alcohol occasionally. No illicit drug use.   FAMILY HISTORY: Denies any coronary artery disease at young age. Reports her grandmother had history of heart attack but not at a young age. History of CVA/TIA in her father.   REVIEW OF SYSTEMS:  CONSTITUTIONAL: Denies fever, chills, fatigue,  weakness, weight gain, weight loss.  EYES: Denies blurry vision, double vision, inflammation, glaucoma.  ENT: Denies tinnitus, ear pain, epistaxis or discharge.  RESPIRATORY: Denies any cough, wheezing, hemoptysis, painful respiratory, COPD. Reports mild shortness of breath.  CARDIOVASCULAR: Denies any chest pain but complains of indigestion. Denies any edema, dyspnea on exertion. Complains of palpitation and syncope.  GASTROINTESTINAL: Denies nausea, vomiting, diarrhea, abdominal pain, hematemesis, jaundice, rectal bleed.  GENITOURINARY: Denies dysuria, hematuria, renal colic.  ENDOCRINE: Denies polyuria, polydipsia, heat or cold intolerance.  HEMATOLOGY: Denies anemia, easy bruising, bleeding diathesis.  INTEGUMENTARY: Denies acne, rash or skin lesions.  MUSCULOSKELETAL: Complains of chronic left shoulder pain. Denies any gout or arthritis.  NEUROLOGIC: Denies CVA, TIA, seizures, memory loss, headache currently but reports she has headache off and on.  PSYCHIATRIC: Denies any anxiety, insomnia, bipolar disorder or schizophrenia.   PERTINENT LABORATORIES: Glucose 96, BUN 16, creatinine 0.96, sodium 138, potassium 3.6, chloride 104, CO2 27. Troponin 6.8. White blood cells 9.8, hemoglobin 11.7, hematocrit 34.7, platelets 229.   IMAGING: CT head without contrast and CT cervical spine without contrast showing no acute findings.   EKG: Showing normal sinus rhythm at 64 beats per minute with T wave inversion in aVF and lead III.   ASSESSMENT AND PLAN:  1. Non-ST elevated myocardial infarction.  2. The patient presents with complaints of indigestion, syncope. Had EKG changes and elevated troponin. This is most likely related to cardiac event, non-ST elevated myocardial infarction. The patient will be admitted to telemetry unit. Already received subcutaneous Lovenox and aspirin. Will start her on metoprolol as  well and sublingual nitroglycerin. Will check her lipid panel. Will continue to cycle her  cardiac enzymes. Monitor her on telemetry. Consult cardiology service. She will be kept n.p.o. after breakfast for possible need for any procedure that seems necessary by cardiology. Will continue her on subcutaneous Lovenox.  3. Syncope: This is most likely related to her non-ST elevated myocardial infarction. The patient's CT head and cervical spine show no acute finding. Will continue on telemetry monitor. Will follow her cardiac enzymes and will check 2-D echocardiogram.  4. Hypertension, uncontrolled: Will resume her back on losartan and hydrochlorothiazide. Will add beta blockers.  5. Deep vein thrombosis prophylaxis: The patient is on full-dose anticoagulation for non-ST elevation myocardial infarction.  6. Gastrointestinal prophylaxis: The patient is on proton pump inhibitor.   CODE STATUS: The patient is FULL CODE.   TOTAL TIME SPENT ON ADMISSION AND PATIENT CARE: 50 minutes.   ____________________________ Starleen Arms, MD dse:gb D: 07/07/2013 02:34:26 ET T: 07/07/2013 04:09:57 ET JOB#: [id number]  cc: Starleen Arms, MD, <Dictator> Levon Penning Teena Irani MD ELECTRONICALLY SIGNED 07/08/2013 5:02

## 2015-01-14 NOTE — H&P (Signed)
PATIENT NAME:  Jill Ball, Jill Ball MR#:  [id number] DATE OF BIRTH:  [birth year]  DATE OF ADMISSION:  07/09/2013  PRIMARY CARE PHYSICIAN:  Dr. Brayton El.   PRIMARY CARDIOLOGIST:  Dr. Mariah Milling.   REFERRING PHYSICIAN: Dr. Scotty Court.   CHIEF COMPLAINT: Chest pain.   HISTORY OF PRESENT ILLNESS: The patient is a pleasant 49 year old African American female who was just discharged yesterday after non-ST elevation myocardial infarction during which she received a stent into her distal circ. I did discuss the case with Dr. Mariah Milling. She also did have some spasms in her coronaries during the catheterization. She comes in after experiencing chest pain about 3:00 this afternoon. She stated that the pain started acutely, sharp again, just like her previous chest pain where she feels like this is indigestion. The pain does have some radiation going to the neck and left shoulder and she has some shortness of breath. She came into the hospital and had troponin done. The troponins are trending down. She says she had a 7 out of 10 pain despite having three nitroglycerins, but the nitroglycerin did help the pain a little, but now they are back. The case was also discussed with her cardiology by ER physician with the recommendation of the nitroglycerin drip and heparin drip. Hospitalist services were contacted for further evaluation and management.   PAST MEDICAL HISTORY: Hypertension, recent non-ST elevation myocardial infarction, hyperlipidemia, recent syncope.   PAST SURGICAL HISTORY: None.   ALLERGIES: Denies.   OUTPATIENT MEDICATIONS: Aspirin 81 mg daily, hydrochlorothiazide 25 mg daily, Lipitor 20 mg at bedtime, losartan 25 mg daily, metoprolol 12.5 mg every 12 hours, nitroglycerin sublingual p.r.n., prasugrel 10 mg once a day.   REVIEW OF SYSTEMS:   CONSTITUTIONAL: Denies fevers, chills, fatigue has been trying to lose weight intentionally.  EYES: Has some chronic blurry vision wears glasses, nearsighted.  ENT: No  tinnitus or hearing loss.  RESPIRATORY: No cough, wheezing, painful respirations.  Did have some shortness of breath with her chest pain.  CARDIOVASCULAR: Chest pain as above, but also air appears to be just like her previous chest pain with her feelings of indigestion. No swelling in the legs, some dyspnea on exertion had a recent syncope, none since discharge. Some fluttering in her chest.  GASTROINTESTINAL: Some nausea, no vomiting. No bloody stools or dark stools. No rectal bleeding.  GENITOURINARY: Denies dysuria or hematuria.  HEMATOLOGIC/LYMPHATIC:  No anemia or easy bruising.  SKIN: No rashes.  MUSCULOSKELETAL: Denies arthritis or gout, except for some chronic left lower shoulder pain.  NEUROLOGIC:  No focal weakness or numbness.  PSYCHIATRIC:  Has some anxiety, feels depressed.   PHYSICAL EXAMINATION: VITAL SIGNS: Temperature on arrival 98.8, pulse rate 68, respiratory rate 18, initial blood pressure 161/90 the last blood pressure 122/69, oxygen saturation 98% on room air.  GENERAL: The patient obese female lying in bed in no obvious distress.  HEENT: Normocephalic, atraumatic. Pupils are equal and reactive. Anicteric sclerae. Extraocular muscles intact.  NECK: Supple. No thyroid tenderness. No cervical lymphadenopathy.  CARDIOVASCULAR: S1, S2 regularly irregular. No significant murmurs appreciated.  LUNGS: Clear to auscultation.  ABDOMEN: Soft, nontender, nondistended. Positive bowel sounds in all quadrants.  EXTREMITIES: No significant lower extremity edema.  SKIN: No obvious rashes.  NEUROLOGIC: Cranial nerves II through XII grossly intact. Strength is 5/5 in all extremities.   PSYCHIATRIC: Somewhat flat affect, wake, alert, oriented x 3 and cooperative.   LABORATORY AND RADIOLOGICAL DATA: Troponin is 1.4. CK-MB is 0.7, and CK total is 131; these are  all lower than upon discharge. Last troponin is 3.4, prior to that from October 14, hemoglobin of 12.1, platelets are 237, WBC is  8.8. LFTs are within normal limits. BUN 11, creatinine 0.97, sodium 138, potassium 3.5.   EKG shows normal sinus rhythm. There is some flattening of T waves in V5, V6 as well as 2, but no acute ST elevations or depressions.   ASSESSMENT AND PLAN: We have a 49 year old female with recent non-ST elevation myocardial infarction, status post drug-eluding stent to circ with some spasming during the catheter as well as moderate LAD disease, hypertension, who was discharged yesterday comes in for unstable angina with ongoing chest pain and despite nitroglycerin. I have discussed the case with cardiology. At this point, we will admit the patient to the Intensive Care Unit with heparin drip and nitro drip and try to control the pain if we can.  We will cycle her troponins to see if there is trending or they are trending down. If there are trending up and she is still symptomatic, the possibility of cardiac catheter is on the table. We will make her nothing oral past midnight and tell her not to eat any food in case she is going for cardiac catheter. Continue her prasugrel, aspirin as well as the beta blocker low dose. I will start her on some morphine as needed as well. Continue her cholesterol medication as well and consulted cardiology. She has had a recent echocardiogram, and this will not be repeated this admission.   TOTAL CRITICAL CARE TIME SPENT: 50 minutes.   CODE STATUS: The patient is full code    ____________________________ Krystal EatonShayiq Lailie Smead, MD sa:cc D: 07/09/2013 19:16:06 ET T: 07/09/2013 19:38:18 ET JOB#: [id number]  cc: Krystal EatonShayiq Belvia Gotschall, MD, <Dictator> Krystal EatonSHAYIQ Calisha Tindel MD ELECTRONICALLY SIGNED 07/24/2013 14:51

## 2015-01-14 NOTE — Consult Note (Signed)
General Aspect 49 year old African American w/ PMHx s/f  HTN who was recently discharged after receiving DES-90% distal LCx lesion in the setting of NSTEMI.   She tolerated the procedure well. There was residual 50% mid LAD disease. EF > 55%. She was started on DAPT- ASA/effient. She received samples from the office the day of discharge. She has not missed a dose. She was seated at her computer when she experienced left sided chest discomfort radiating to her neck and L arm similar in quality prior to her recent PCI. The pain persisted and she presented to the ED.   In the ER, EKG indicated unchanged TWIs in III, aVF, LVH. Initial TnI 1.40, CK-MB WNL. Next TnI 1.20. Compared with CEs from prior admission, troponin trend indicated an appropriate down-trend. CMP and CBC WNL.   Present Illness . PAST SURGICAL HISTORY: None.   ALLERGIES: No known drug allergies.   HOME MEDICATIONS:  1. Losartan 25 mg oral daily.  2. Hydrochlorothiazide 25 mg oral daily.   SOCIAL HISTORY: The patient works as a Psychologist, sport and exercise. She denies any smoking. Drinks alcohol occasionally. No illicit drug use.   FAMILY HISTORY: Denies any coronary artery disease at young age. Reports her grandmother had history of heart attack but not at a young age. History of CVA/TIA in her father.   Physical Exam:  GEN well developed, well nourished, no acute distress, significant tenderness with palpation of her upper chest, neck   HEENT hearing intact to voice, moist oral mucosa   NECK supple    RESP normal resp effort  clear BS    CARD Regular rate and rhythm  Bradycardic    ABD denies tenderness  soft    LYMPH negative neck   EXTR negative edema   SKIN normal to palpation   NEURO motor/sensory function intact   PSYCH alert       MI - Myocardial Infarct:    HTN:    Stent - Cardiac:   Home Medications:  Medication Instructions Status  Lipitor 20 mg oral tablet 1 tab(s) orally once a day (at bedtime)  Active  nitroglycerin 0.4 mg sublingual tablet 1 tab(s) sublingual every 5 minutes, As Needed, chest pain , As needed, chest pain x 3 Active  aspirin 81 mg oral tablet, chewable 1 tab(s) orally once a day Active  prasugrel 10 mg oral tablet 1 tab(s) orally once a day Active  metoprolol 12.5 milligram(s) orally every 12 hours Active  losartan 25 mg oral tablet 1 tab(s) orally once a day Active  hydrochlorothiazide 25 mg oral tablet 1 tab(s) orally once a day Active   Lab Results:  Hepatic:  16-Oct-14 16:46   Bilirubin, Total 0.3  Alkaline Phosphatase 85  SGPT (ALT) 20  SGOT (AST) 32  Total Protein, Serum 7.6  Albumin, Serum 3.8  Routine Chem:  16-Oct-14 16:46   Result Comment TROPONIN - RESULTS VERIFIED BY REPEAT TESTING.  - NOTIFIED RN Dory Larsen AT [id number] VFM  - 07/09/13  - READ-BACK PROCESS PERFORMED.  Result(s) reported on 09 Jul 2013 at 05:49PM.  Glucose, Serum 85  BUN 11  Creatinine (comp) 0.97  Sodium, Serum 138  Potassium, Serum 3.5  Chloride, Serum 104  CO2, Serum 29  Calcium (Total), Serum 9.1  Anion Gap  5  Osmolality (calc) 274  eGFR (African American) >60  eGFR (Non-African American) >60 (eGFR values <80m/min/1.73 m2 may be an indication of chronic kidney disease (CKD). Calculated eGFR is useful in patients with stable renal function.  The eGFR calculation will not be reliable in acutely ill patients when serum creatinine is changing rapidly. It is not useful in  patients on dialysis. The eGFR calculation may not be applicable to patients at the low and high extremes of body sizes, pregnant women, and vegetarians.)  17-Oct-14 02:03   Result Comment TROPONIN - RESULTS VERIFIED BY REPEAT TESTING.  - PREV. C/ 07-09-13 @1747  BY [facility]. AJO  Result(s) reported on 10 Jul 2013 at 03:54AM.  Glucose, Serum  106  BUN 12  Creatinine (comp) 0.91  Sodium, Serum 141  Potassium, Serum  3.3  Chloride, Serum 107  CO2, Serum 30  Calcium (Total), Serum 8.5  Anion Gap  4   Osmolality (calc) 281  eGFR (African American) >60  eGFR (Non-African American) >60 (eGFR values <66m/min/1.73 m2 may be an indication of chronic kidney disease (CKD). Calculated eGFR is useful in patients with stable renal function. The eGFR calculation will not be reliable in acutely ill patients when serum creatinine is changing rapidly. It is not useful in  patients on dialysis. The eGFR calculation may not be applicable to patients at the low and high extremes of body sizes, pregnant women, and vegetarians.)  Cardiac:  16-Oct-14 16:46   Troponin I  1.40 (0.00-0.05 0.05 ng/mL or less: NEGATIVE  Repeat testing in 3-6 hrs  if clinically indicated. >0.05 ng/mL: POTENTIAL  MYOCARDIAL INJURY. Repeat  testing in 3-6 hrs if  clinically indicated. NOTE: An increase or decrease  of 30% or more on serial  testing suggests a  clinically important change)  CK, Total 131  CPK-MB, Serum 0.7 (Result(s) reported on 09 Jul 2013 at 05:40PM.)  17-Oct-14 02:03   Troponin I  1.20 (0.00-0.05 0.05 ng/mL or less: NEGATIVE  Repeat testing in 3-6 hrs  if clinically indicated. >0.05 ng/mL: POTENTIAL  MYOCARDIAL INJURY. Repeat  testing in 3-6 hrs if  clinically indicated. NOTE: An increase or decrease  of 30% or more on serial  testing suggests a  clinically important change)  CK, Total 98  CPK-MB, Serum 0.8 (Result(s) reported on 10 Jul 2013 at 03:28AM.)  Routine Coag:  16-Oct-14 16:46   Activated PTT (APTT) 32.0 (A HCT value >55% may artifactually increase the APTT. In one study, the increase was an average of 19%. Reference: "Effect on Routine and Special Coagulation Testing Values of Citrate Anticoagulant Adjustment in Patients with High HCT Values." American Journal of Clinical Pathology 2006;126:400-405.)  17-Oct-14 02:03   Activated PTT (APTT)  132.1 (A HCT value >55% may artifactually increase the APTT. In one study, the increase was an average of 19%. Reference: "Effect on  Routine and Special Coagulation Testing Values of Citrate Anticoagulant Adjustment in Patients with High HCT Values." American Journal of Clinical Pathology 2006;126:400-405.)  Routine Hem:  16-Oct-14 16:46   WBC (CBC) 8.8  RBC (CBC) 4.34  Hemoglobin (CBC) 12.1  Hematocrit (CBC) 36.3  Platelet Count (CBC) 237 (Result(s) reported on 09 Jul 2013 at 05:16PM.)  MCV 84  MCH 27.9  MCHC 33.4  RDW  14.7  17-Oct-14 02:03   WBC (CBC) 7.8  RBC (CBC)  3.73  Hemoglobin (CBC)  10.7  Hematocrit (CBC)  31.4  Platelet Count (CBC) 213  MCV 84  MCH 28.7  MCHC 34.1  RDW 14.5  Neutrophil % 64.6  Lymphocyte % 27.7  Monocyte % 6.8  Eosinophil % 0.7  Basophil % 0.2  Neutrophil # 5.0  Lymphocyte # 2.2  Monocyte # 0.5  Eosinophil # 0.1  Basophil #  0.0 (Result(s) reported on 10 Jul 2013 at 02:55AM.)   Radiology Results:  XRay:    16-Oct-14 17:12, Chest Portable Single View  Chest Portable Single View   REASON FOR EXAM:    Chest Pain  COMMENTS:       PROCEDURE: DXR - DXR PORTABLE CHEST SINGLE VIEW  - Jul 09 2013  5:12PM     RESULT: Comparison is made to the study of July 06, 2013.    The lungs are adequately inflated and clear. The cardiac silhouette is   top normal in size. The pulmonary vascularity is not engorged. There is   no pleural effusion. The observed portions of the bony thorax appear   normal.    IMPRESSION:  There is no evidence of acute cardiopulmonary abnormality. A   followup PA and lateral chest x-ray would be of value.   Dictation Site: 5        Verified By: DAVID A. Martinique, M.D., MD    No Known Allergies:   Vital Signs/Nurse's Notes:  **Vital Signs.:   17-Oct-14 08:00  Vital Signs Type Routine  Temperature Temperature (F) 98.9  Celsius 37.1  Temperature Source oral  Pulse Pulse 64  Respirations Respirations 12  Pulse Ox % Pulse Ox % 99  Oxygen Delivery Room Air/ 21 %  Pulse Ox Heart Rate 53    Impression 49 year old African American w/ PMHx s/f   HTN who was recently discharged after receiving DES-90% distal LCx lesion in the setting of NSTEMI.   1) Chest pain: recent stent to LCX with residual disease this last week EKG unchanged, cardiac enz trending down from initial MI Pain has some atypical features, reproducible with palpation in upper chest, neck. Sx suggestive of musculoskeletal etiology, paravertebral muscle spasms. She has had this pain before --Would start vicodin, naproxen, flereril Will hold heparin and NTG infusion If pain improves with aboove meds, would feed, no cath needed --If pain much improved later today, ok to d/c home later today   2) Elevated cardiac enz: elevated and trending down from prior event earlier this week  3) CAD: DES placed to mid to distal LCX several days ago She has been compliant with asa and effient residual LAD disease.  4) Hyperlipidemia: Would continue lipitor for now  5) Hypokalemia Will replete HCTZ held on previous admission   Electronic Signatures: Muaad Boehning A (PA-C)  (Signed 17-Oct-14 08:30)  Authored: General Aspect/Present Illness, Home Medications, Allergies Ida Rogue (MD)  (Signed17-Oct-14 08:52)  Authored: General Aspect/Present Illness, History and Physical Exam, Past Medical History, Home Medications, Labs, Radiology, Vital Signs/Nurse's Notes, Impression/Plan  Co-Signer: General Aspect/Present Illness, Home Medications, Allergies   Last Updated: 17-Oct-14 08:52 by Ida Rogue (MD)

## 2015-04-29 ENCOUNTER — Other Ambulatory Visit: Payer: Self-pay | Admitting: Cardiovascular Disease

## 2015-08-22 ENCOUNTER — Telehealth: Payer: Self-pay | Admitting: Cardiovascular Disease

## 2015-08-22 NOTE — Telephone Encounter (Signed)
3 attempts to schedule from recall list.  LMOV to schedule with office .   Deleting recall.

## 2016-05-23 ENCOUNTER — Ambulatory Visit: Payer: Self-pay | Attending: Oncology

## 2016-06-27 ENCOUNTER — Ambulatory Visit: Payer: Self-pay | Attending: Oncology

## 2016-08-27 IMAGING — MG MM DIGITAL SCREENING BILAT W/ CAD
4 series · 4 of 4 positions shown · non-contrast
Comparison: Previous exam(s).

CLINICAL DATA: Screening.

EXAM:
DIGITAL SCREENING BILATERAL MAMMOGRAM WITH CAD

[R CC]
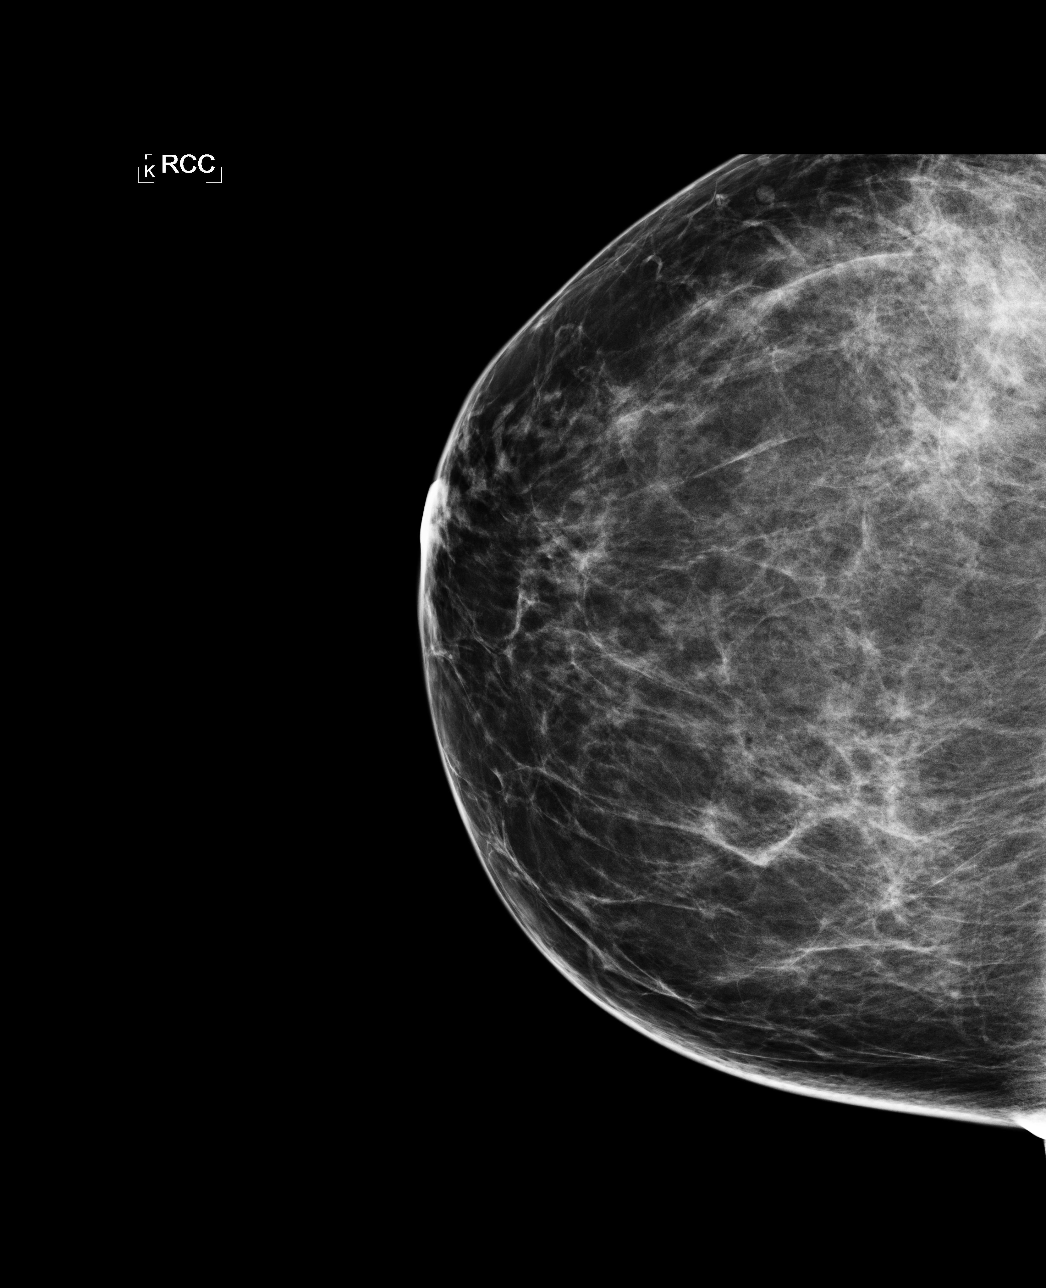

[L CC]
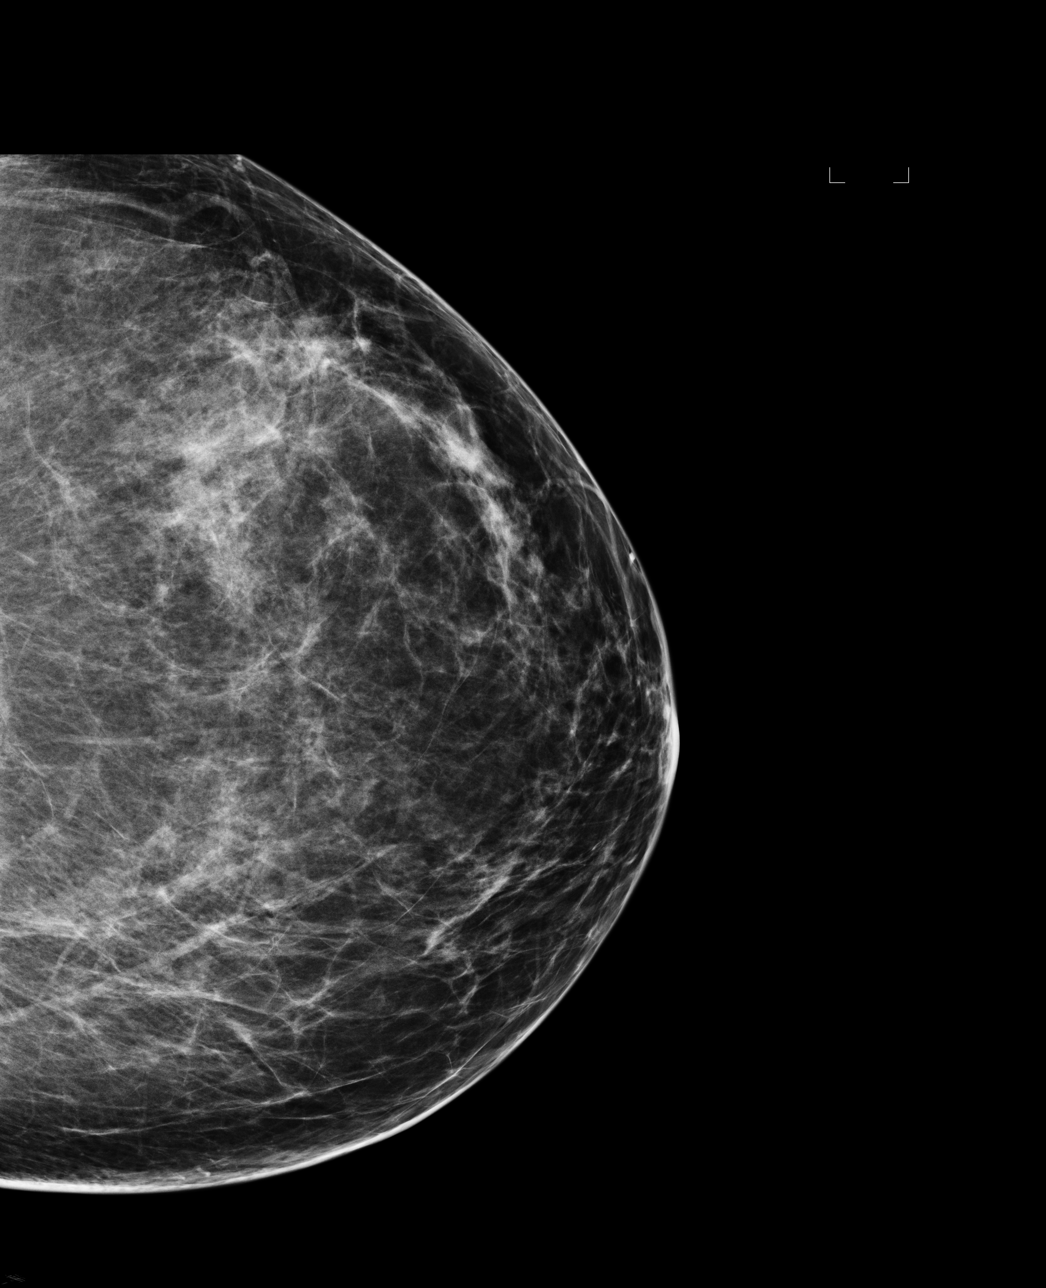

[L MLO]
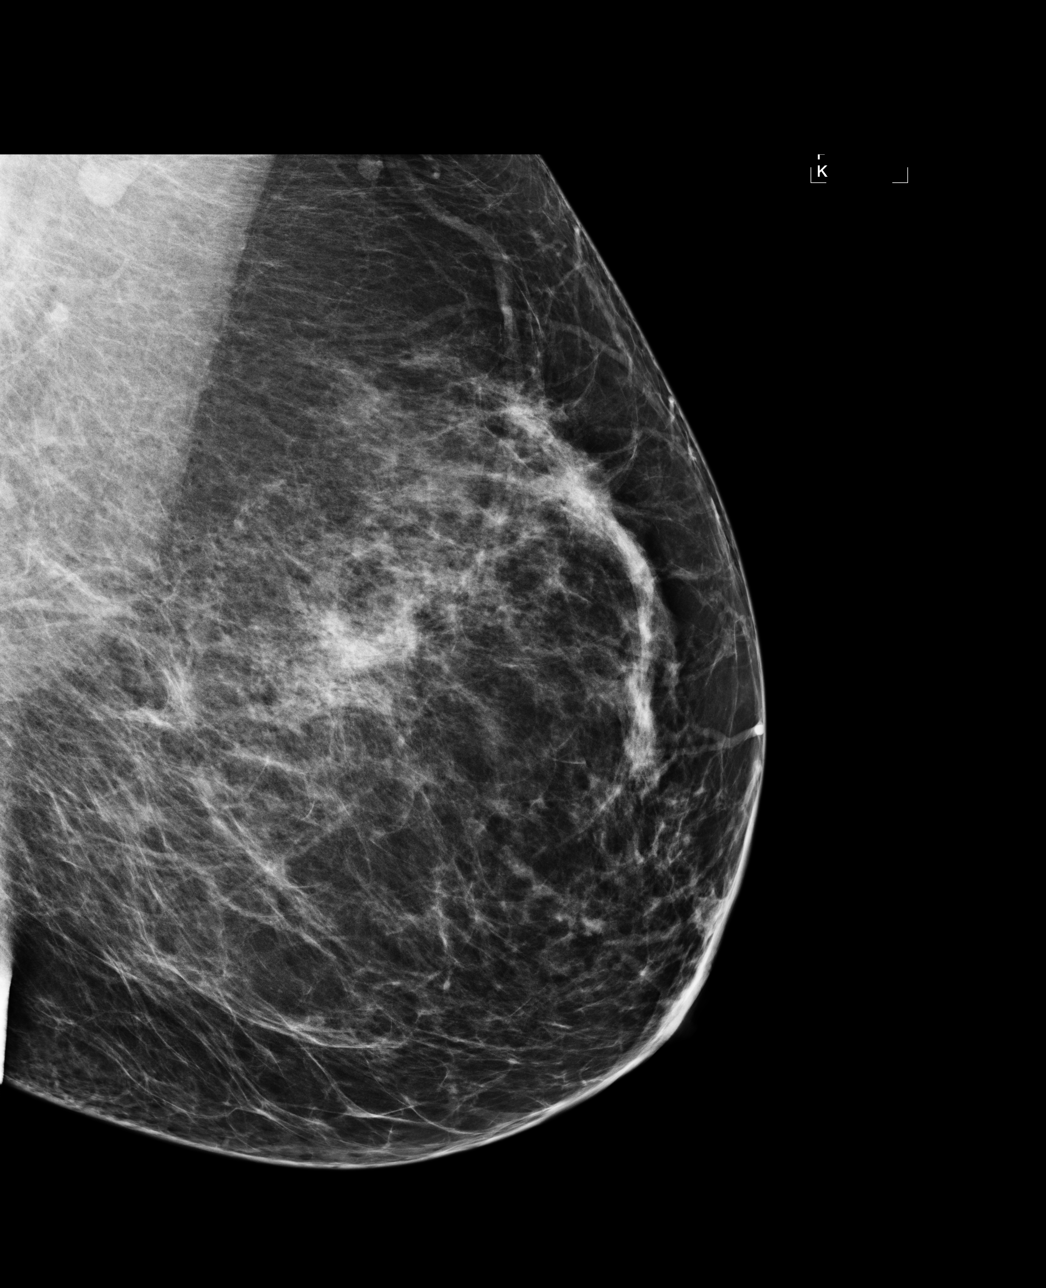

[R MLO]
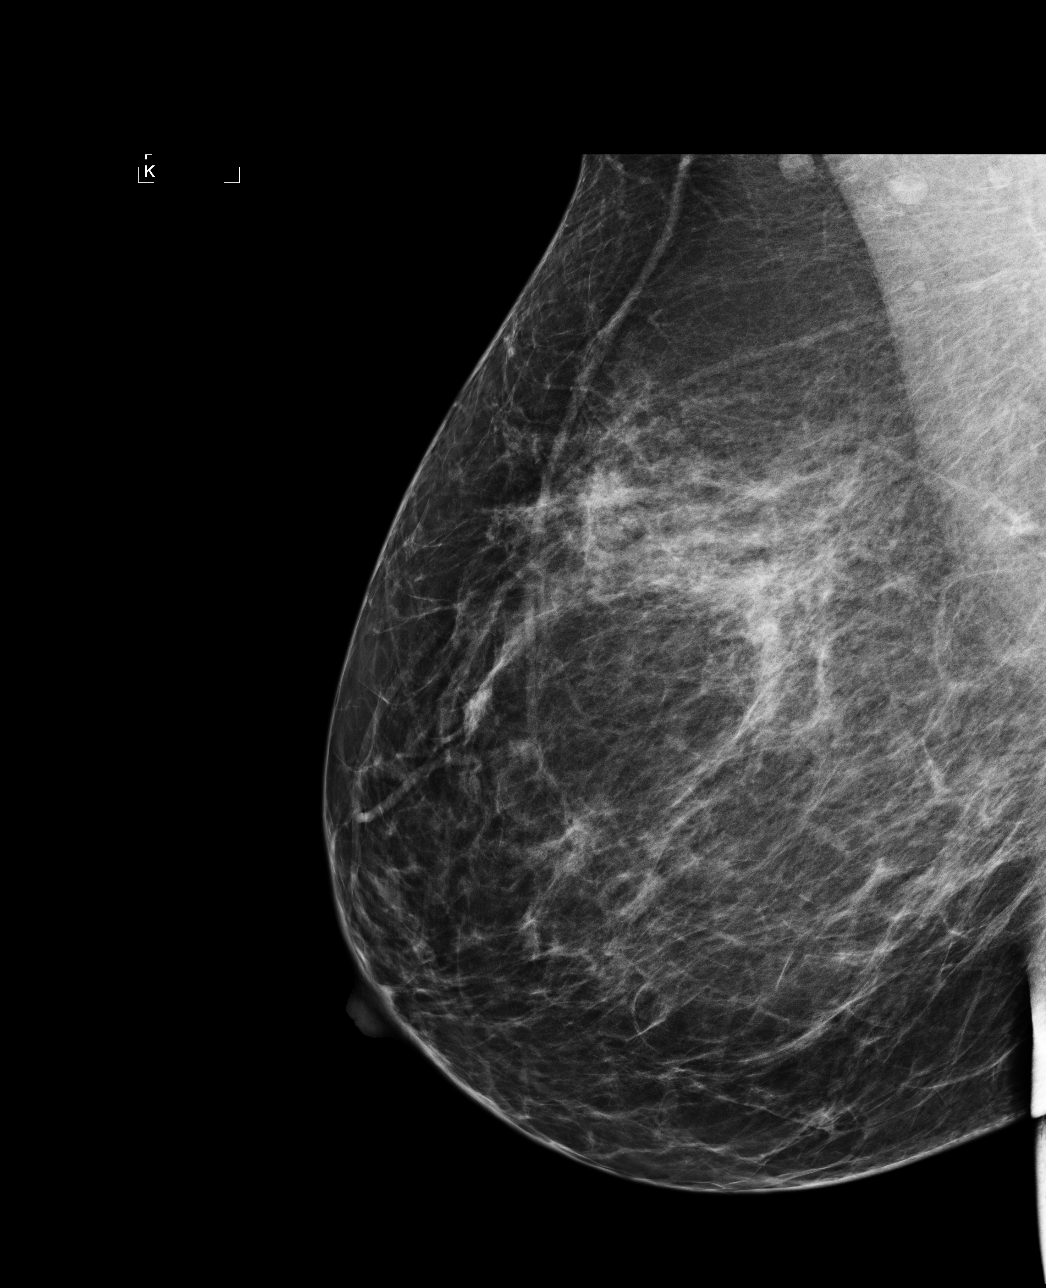

[4 of 4 positions shown; findings below may reference images not displayed]

ACR Breast Density Category b: There are scattered areas of
fibroglandular density.
FINDINGS: In the right breast, a possible asymmetry warrants further
evaluation. In the left breast, no findings suspicious for
malignancy. Images were processed with CAD.
IMPRESSION: Further evaluation is suggested for possible asymmetry in the right
breast.

RECOMMENDATION:
Diagnostic mammogram and possibly ultrasound of the right breast.
(Code:[id number])

The patient will be contacted regarding the findings, and additional
imaging will be scheduled.

BI-RADS CATEGORY  0: Incomplete. Need additional imaging evaluation
and/or prior mammograms for comparison.

## 2016-09-05 ENCOUNTER — Ambulatory Visit
Admission: RE | Admit: 2016-09-05 | Discharge: 2016-09-05 | Disposition: A | Discharge status: Home / Self Care | Payer: Self-pay | Source: Ambulatory Visit | Attending: Oncology | Admitting: Oncology

## 2016-09-05 ENCOUNTER — Ambulatory Visit: Payer: Self-pay | Attending: Oncology | Admitting: *Deleted

## 2016-09-05 ENCOUNTER — Encounter: Payer: Self-pay | Admitting: *Deleted

## 2016-09-05 VITALS — BP 130/84 | HR 76 | Temp 96.6°F | Resp 18 | Ht 68.5 in | Wt 234.5 lb

## 2016-09-05 DIAGNOSIS — Z0000 Encounter for general adult medical examination without abnormal findings: Secondary | ICD-10-CM

## 2016-09-05 NOTE — Progress Notes (Signed)
Subjective:     Patient ID: Lawerance BachDonna R Hernandez, female   DOB: 09/06/1966, 50 y.o.   MRN: [id number]  HPI   Review of Systems     Objective:   Physical Exam  Pulmonary/Chest: Right breast exhibits no inverted nipple, no mass, no nipple discharge, no skin change and no tenderness. Left breast exhibits no inverted nipple, no mass, no nipple discharge, no skin change and no tenderness.  Abdominal: There is no splenomegaly or hepatomegaly.  Genitourinary: No labial fusion. There is no rash, tenderness, lesion or injury on the right labia. There is no rash, tenderness, lesion or injury on the left labia. Cervix exhibits no motion tenderness, no discharge and no friability. Right adnexum displays no mass, no tenderness and no fullness. Left adnexum displays no mass, no tenderness and no fullness. No erythema, tenderness or bleeding in the vagina. No foreign body in the vagina. No signs of injury around the vagina. No vaginal discharge found.    Genitourinary Comments: Very stenosed cervical os       Assessment:     50 year old Black female [facility], pap and mammogram.  Clinical breast exam unremarkable.  Taught self breast awareness.  Specimen collected for pap smear without difficulty.  Patient complains of having 2 periods in a month.  States her primary care provider is aware.  Patient has been screened for eligibility.  She does not have any insurance, Medicare or Medicaid.  She also meets financial eligibility.  Hand-out given on the Affordable Care Act.    Plan:     Screening mammogram ordered.  Specimen for pap sent to the lab.  Will follow-up per [facility] protocol.

## 2016-09-05 NOTE — Patient Instructions (Signed)
HPV Test The human papillomavirus (HPV) test is used to look for high-risk types of HPV infection. HPV is a group of about 100 viruses. Many of these viruses cause growths on, in, or around the genitals. Most HPV viruses cause infections that usually go away without treatment. However, HPV types 6, 11, 16, and 18 are considered high-risk types of HPV that can increase your risk of cancer of the cervix or anus if the infection is left untreated. An HPV test identifies the DNA (genetic) strands of the HPV infection, so it is also referred to as the HPV DNA test. Although HPV is found in both males and females, the HPV test is only used to screen for increased cancer risk in females:  With an abnormal Pap test.  After treatment of an abnormal Pap test.  Between the ages of 30 and 65.  After treatment of a high-risk HPV infection. The HPV test may be done at the same time as a pelvic exam and Pap test in females over the age of 30. Both the HPV test and Pap test require a sample of cells from the cervix. How do I prepare for this test?  Do not douche or take a bath for 24-48 hours before the test or as directed by your health care provider.  Do not have sex for 24-48 hours before the test or as directed by your health care provider.  You may be asked to reschedule the test if you are menstruating.  You will be asked to urinate before the test. What do the results mean? It is your responsibility to obtain your test results. Ask the lab or department performing the test when and how you will get your results. Talk with your health care provider if you have any questions about your results. Your result will be negative or positive. Meaning of Negative Test Results  A negative HPV test result means that no HPV was found, and it is very likely that you do not have HPV. Meaning of Positive Test Results  A positive HPV test result indicates that you have HPV.  If your test result shows the presence  of any high-risk HPV strains, you may have an increased risk of developing cancer of the cervix or anus if the infection is left untreated.  If any low-risk HPV strains are found, you are not likely to have an increased risk of cancer. Discuss your test results with your health care provider. He or she will use the results to make a diagnosis and determine a treatment plan that is right for you. Talk with your health care provider to discuss your results, treatment options, and if necessary, the need for more tests. Talk with your health care provider if you have any questions about your results. This information is not intended to replace advice given to you by your health care provider. Make sure you discuss any questions you have with your health care provider. Document Released: 10/05/2004 Document Revised: 05/16/2016 Document Reviewed: 01/26/2014 Elsevier Interactive Patient Education  2017 [employer]  Gave patient hand-out, Women Staying Healthy, Active and Well from [facility], with education on breast health, pap smears, heart and colon health.  

## 2016-09-09 LAB — PAP LB AND HPV HIGH-RISK
HPV, HIGH-RISK: NEGATIVE
PAP SMEAR COMMENT: 0

## 2016-09-11 ENCOUNTER — Encounter: Payer: Self-pay | Admitting: *Deleted

## 2016-09-11 NOTE — Progress Notes (Signed)
Letter mailed to inform patient of her normal mammogram and pap smear.  Next mammo due in one year, and pap in 5 years.  HSIS to [place].

## 2018-04-09 ENCOUNTER — Encounter (INDEPENDENT_AMBULATORY_CARE_PROVIDER_SITE_OTHER): Payer: Self-pay

## 2018-04-09 ENCOUNTER — Ambulatory Visit
Admission: RE | Admit: 2018-04-09 | Discharge: 2018-04-09 | Disposition: A | Discharge status: Home / Self Care | Payer: Self-pay | Source: Ambulatory Visit | Attending: Oncology | Admitting: Oncology

## 2018-04-09 ENCOUNTER — Other Ambulatory Visit: Payer: Self-pay

## 2018-04-09 ENCOUNTER — Ambulatory Visit: Payer: Self-pay | Attending: Oncology | Admitting: *Deleted

## 2018-04-09 ENCOUNTER — Encounter: Payer: Self-pay | Admitting: *Deleted

## 2018-04-09 VITALS — BP 147/80 | HR 71 | Temp 97.0°F | Ht 69.0 in | Wt 235.0 lb

## 2018-04-09 DIAGNOSIS — Z0000 Encounter for general adult medical examination without abnormal findings: Secondary | ICD-10-CM

## 2018-04-09 NOTE — Progress Notes (Addendum)
  Subjective:     Patient ID: Jill Ball, female   DOB: 02/24/1966, 52 y.o.   MRN: [id number]  HPI   Review of Systems     Objective:   Physical Exam  Pulmonary/Chest: Right breast exhibits tenderness. Right breast exhibits no inverted nipple, no mass, no nipple discharge and no skin change. Left breast exhibits no inverted nipple, no mass, no nipple discharge, no skin change and no tenderness.       Assessment:     52 year old Black female [facility]  Clinical breast exam unremarkable.  Patient complains of right breast tenderness on exam.  States she is still having her periods and they are irregular.  States she is probably getting ready to start her cycle.  Taught self breast awareness.  Last pap 09/05/16 was negative/negative.  Next pap due in 2022. Blood pressure elevated at 147/80.  She is to recheck her blood pressure at [facility] or [facility], and if remains higher than 140/90 she is to follow-up with her primary care provider.  Hand out on hypertention given to patient. Patient has been screened for eligibility.  She does not have any insurance, Medicare or Medicaid.  She also meets financial eligibility.  Hand-out given on the Affordable Care Act. Risk Assessment    Risk Scores      04/09/2018   Last edited by: Scarlett PrestoShaver, Anne F, RN   5-year risk: 1.2 %   Lifetime risk: 8.3 %            Plan:     Screening mammogram ordered.  Will follow-up per [facility] protocol.

## 2018-04-09 NOTE — Progress Notes (Unsigned)
Letter mailed from the [facility] to inform patient of her normal mammogram results.  Patient is to follow-up with annual screening in one year.  HSIS to [place]y. 

## 2020-05-03 ENCOUNTER — Other Ambulatory Visit: Payer: Self-pay

## 2020-05-03 ENCOUNTER — Ambulatory Visit (INDEPENDENT_AMBULATORY_CARE_PROVIDER_SITE_OTHER): Payer: 59 | Admitting: Cardiovascular Disease

## 2020-05-03 ENCOUNTER — Encounter: Payer: Self-pay | Admitting: Cardiovascular Disease

## 2020-05-03 VITALS — BP 130/84 | HR 61 | Ht 66.5 in | Wt 238.2 lb

## 2020-05-03 DIAGNOSIS — I10 Essential (primary) hypertension: Secondary | ICD-10-CM | POA: Diagnosis not present

## 2020-05-03 DIAGNOSIS — E782 Mixed hyperlipidemia: Secondary | ICD-10-CM

## 2020-05-03 DIAGNOSIS — I25118 Atherosclerotic heart disease of native coronary artery with other forms of angina pectoris: Secondary | ICD-10-CM

## 2020-05-03 MED ORDER — AMLODIPINE BESYLATE 10 MG PO TABS: 10.0000 mg | ORAL_TABLET | Freq: Every day | ORAL | 3 refills | Status: DC

## 2020-05-03 MED ORDER — METOPROLOL TARTRATE 25 MG PO TABS: 25.0000 mg | ORAL_TABLET | Freq: Two times a day (BID) | ORAL | 3 refills | Status: DC

## 2020-05-03 MED ORDER — LOSARTAN POTASSIUM 100 MG PO TABS: 100.0000 mg | ORAL_TABLET | Freq: Every day | ORAL | 3 refills | Status: DC

## 2020-05-03 NOTE — Progress Notes (Addendum)
Cardiology Office Note  Date:  05/03/2020   ID:  Jill Ball, DOB 21-Apr-1966, MRN [id number]  PCP:  Letta Median, MD   Chief Complaint  Patient presents with  . office visit    F/U appointment; Meds verbally reviewed with patient.    HPI:  Jill Ball is a 54 y.o. African American female w/ PMHx s/f  CAD (s/p DES-LCx 06/2013),  HTN,  HLD  who presents today for follow-up of her CAD  No anginal sx Weight up Not on lipitor No recent lipids available Lab work requested through primary care Last primary care office note also requested  No regular exercise program Working, travels, working with vaccines in the community  Weight has trended higher  EKG personally reviewed by myself on todays visit Normal sinus rhythm rate 61 bpm left axis deviation, T wave abnormality  Prior catheterization results discussed from 2014  90% LCx lesion treated with a DES. She had residual 50% LAD disease. EF > 55%. Coronary vasospasm was noted. She was started on DAPT- ASA/Effient.  Previous 2D echo indicated EF 50-55%, impaired LV diastolic dysfunction, mild LA dilatation, mild MR, mild-mod AI, mild TR, RVSP 40 mmHg and mildly elevated PASP.      PMH:   has a past medical history of Coronary artery disease, Hyperlipidemia, Hypertension, MI (myocardial infarction) ([place]), and Syncope and collapse.  PSH:    Past Surgical History:  Procedure Laterality Date  . CARDIAC CATHETERIZATION  06/2013   [facility]  . CORONARY ANGIOPLASTY  2014   stent placement x 1     Current Outpatient Medications  Medication Sig Dispense Refill  . amLODipine (NORVASC) 10 MG tablet Take 1 tablet (10 mg total) by mouth daily. 90 tablet 3  . aspirin 81 MG tablet Take 81 mg by mouth daily.    Marland Kitchen losartan (COZAAR) 100 MG tablet Take 1 tablet (100 mg total) by mouth daily. 90 tablet 3  . metoprolol tartrate (LOPRESSOR) 25 MG tablet Take 1 tablet (25 mg total) by mouth 2 (two) times daily. 180 tablet 3   No  current facility-administered medications for this visit.     Allergies:   Patient has no known allergies.   Social History:  The patient  reports that she has never smoked. She has never used smokeless tobacco. She reports current alcohol use. She reports that she does not use drugs.   Family History:   family history includes Diabetes in her mother; Hypertension in her father and mother.    Review of Systems: Review of Systems  Constitutional: Negative.   HENT: Negative.   Respiratory: Negative.   Cardiovascular: Negative.   Gastrointestinal: Negative.   Musculoskeletal: Negative.   Neurological: Negative.   Psychiatric/Behavioral: Negative.   All other systems reviewed and are negative.   PHYSICAL EXAM: VS:  BP 130/84 (BP Location: Left Arm, Patient Position: Sitting, Cuff Size: Large)   Pulse 61   Ht 5' 6.5" (1.689 m)   Wt 238 lb 4 oz (108.1 kg)   SpO2 95%   BMI 37.88 kg/m  , BMI Body mass index is 37.88 kg/m. GEN: Well nourished, well developed, in no acute distress, obese HEENT: normal Neck: no JVD, carotid bruits, or masses Cardiac: RRR; no murmurs, rubs, or gallops,no edema  Respiratory:  clear to auscultation bilaterally, normal work of breathing GI: soft, nontender, nondistended, + BS MS: no deformity or atrophy Skin: warm and dry, no rash Neuro:  Strength and sensation are intact Psych: euthymic mood,  full affect   Recent Labs: No results found for requested labs within last 8760 hours.    Lipid Panel Lab Results  Component Value Date   CHOL 167 07/07/2013   HDL 74 (H) 07/07/2013   LDLCALC 74 07/07/2013   TRIG 94 07/07/2013      Wt Readings from Last 3 Encounters:  05/03/20 238 lb 4 oz (108.1 kg)  04/09/18 235 lb (106.6 kg)  09/05/16 234 lb 7.4 oz (106.4 kg)       ASSESSMENT AND PLAN:  Problem List Items Addressed This Visit      Cardiology Problems   Essential hypertension   Relevant Medications   amLODipine (NORVASC) 10 MG tablet    losartan (COZAAR) 100 MG tablet   metoprolol tartrate (LOPRESSOR) 25 MG tablet   CAD (coronary artery disease) - Primary   Relevant Medications   amLODipine (NORVASC) 10 MG tablet   losartan (COZAAR) 100 MG tablet   metoprolol tartrate (LOPRESSOR) 25 MG tablet   Other Relevant Orders   EKG 12-Lead   Comp Met (CMET)   Lipid panel   HgB A1c   Hyperlipidemia   Relevant Medications   amLODipine (NORVASC) 10 MG tablet   losartan (COZAAR) 100 MG tablet   metoprolol tartrate (LOPRESSOR) 25 MG tablet     Coronary disease with stable angina Discussed prior cardiac catheterization, Is not on cholesterol medication Prior lab work in notes from primary care have been requested -Ordered new lab work for today in our office -Long discussion concerning anginal symptoms to watch for  Hyperlipidemia Stressed importance of aggressive control, goal LDL less than 70 Order placed for lipids today Would likely need to be on a statin   Essential hypertension Blood pressure is well controlled on today's visit. No changes made to the medications. Stable  Diabetes type 2 with complications We have encouraged continued exercise, careful diet management in an effort to lose weight. We have ordered hemoglobin A1c on lab draw today    Disposition:   F/U  12 months   Total encounter time more than 45 minutes  Greater than 50% was spent in counseling and coordination of care with the patient    Signed, Esmond Plants, M.D., Ph.D. [place], [place]

## 2020-05-03 NOTE — Patient Instructions (Addendum)
Medication Instructions:  No changes  If you need a refill on your cardiac medications before your next appointment, please call your pharmacy.    Lab work: Labs today:CMP , lipids, HBA1c   If you have labs (blood work) drawn today and your tests are completely normal, you will receive your results only by: Marland Kitchen MyChart Message (if you have MyChart) OR . A paper copy in the mail If you have any lab test that is abnormal or we need to change your treatment, we will call you to review the results.   Testing/Procedures: No new testing needed   Follow-Up: At [facility], you and your health needs are our priority.  As part of our continuing mission to provide you with exceptional heart care, we have created designated Provider Care Teams.  These Care Teams include your primary Cardiologist (physician) and Advanced Practice Providers (APPs -  Physician Assistants and Nurse Practitioners) who all work together to provide you with the care you need, when you need it.  . You will need a follow up appointment in 12 months .   Marland Kitchen Providers on your designated Care Team:   . Nicolasa Ducking, NP . Eula Listen, PA-C . Marisue Ivan, PA-C  Any Other Special Instructions Will Be Listed Below (If Applicable).  COVID-19 Vaccine Information can be found at: [email or web address] For questions related to vaccine distribution or appointments, please email [email or web address] or call [phone].      Cholesterol Content in Foods Cholesterol is a waxy, fat-like substance that helps to carry fat in the blood. The body needs cholesterol in small amounts, but too much cholesterol can cause damage to the arteries and heart. Most people should eat less than 200 milligrams (mg) of cholesterol a day. Foods with cholesterol  Cholesterol is found in animal-based foods, such as meat, seafood, and dairy. Generally, low-fat dairy and lean meats  have less cholesterol than full-fat dairy and fatty meats. The milligrams of cholesterol per serving (mg per serving) of common cholesterol-containing foods are listed below. Meat and other proteins  Egg -- one large whole egg has 186 mg.  Veal shank -- 4 oz has 141 mg.  Lean ground [place] (93% lean) -- 4 oz has 118 mg.  Fat-trimmed lamb loin -- 4 oz has 106 mg.  Lean ground beef (90% lean) -- 4 oz has 100 mg.  Lobster -- 3.5 oz has 90 mg.  Pork loin chops -- 4 oz has 86 mg.  Canned salmon -- 3.5 oz has 83 mg.  Fat-trimmed beef top loin -- 4 oz has 78 mg.  Frankfurter -- 1 frank (3.5 oz) has 77 mg.  Crab -- 3.5 oz has 71 mg.  Roasted chicken without skin, white meat -- 4 oz has 66 mg.  Light bologna -- 2 oz has 45 mg.  Deli-cut [place] -- 2 oz has 31 mg.  Canned tuna -- 3.5 oz has 31 mg.  Tomasa Blase -- 1 oz has 29 mg.  Oysters and mussels (raw) -- 3.5 oz has 25 mg.  Mackerel -- 1 oz has 22 mg.  Trout -- 1 oz has 20 mg.  Pork sausage -- 1 link (1 oz) has 17 mg.  Salmon -- 1 oz has 16 mg.  Tilapia -- 1 oz has 14 mg. Dairy  Soft-serve ice cream --  cup (4 oz) has 103 mg.  Whole-milk yogurt -- 1 cup (8 oz) has 29 mg.  Cheddar cheese -- 1 oz has 28 mg.  American cheese -- 1  oz has 28 mg.  Whole milk -- 1 cup (8 oz) has 23 mg.  2% milk -- 1 cup (8 oz) has 18 mg.  Cream cheese -- 1 tablespoon (Tbsp) has 15 mg.  Cottage cheese --  cup (4 oz) has 14 mg.  Low-fat (1%) milk -- 1 cup (8 oz) has 10 mg.  Sour cream -- 1 Tbsp has 8.5 mg.  Low-fat yogurt -- 1 cup (8 oz) has 8 mg.  Nonfat Greek yogurt -- 1 cup (8 oz) has 7 mg.  Half-and-half cream -- 1 Tbsp has 5 mg. Fats and oils  Cod liver oil -- 1 tablespoon (Tbsp) has 82 mg.  Butter -- 1 Tbsp has 15 mg.  Lard -- 1 Tbsp has 14 mg.  Bacon grease -- 1 Tbsp has 14 mg.  Mayonnaise -- 1 Tbsp has 5-10 mg.  Margarine -- 1 Tbsp has 3-10 mg. Exact amounts of cholesterol in these foods may vary depending on  specific ingredients and brands. Foods without cholesterol Most plant-based foods do not have cholesterol unless you combine them with a food that has cholesterol. Foods without cholesterol include:  Grains and cereals.  Vegetables.  Fruits.  Vegetable oils, such as olive, canola, and sunflower oil.  Legumes, such as peas, beans, and lentils.  Nuts and seeds.  Egg whites. Summary  The body needs cholesterol in small amounts, but too much cholesterol can cause damage to the arteries and heart.  Most people should eat less than 200 milligrams (mg) of cholesterol a day. This information is not intended to replace advice given to you by your health care provider. Make sure you discuss any questions you have with your health care provider. Document Revised: 08/23/2017 Document Reviewed: 05/07/2017 Elsevier Patient Education  2020 Elsevier Inc.  Fat and Cholesterol Restricted Eating Plan Getting too much fat and cholesterol in your diet may cause health problems. Choosing the right foods helps keep your fat and cholesterol at normal levels. This can keep you from getting certain diseases. Your doctor may recommend an eating plan that includes:  Total fat: ______% or less of total calories a day.  Saturated fat: ______% or less of total calories a day.  Cholesterol: less than _________mg a day.  Fiber: ______g a day. What are tips for following this plan? Meal planning  At meals, divide your plate into four equal parts: ? Fill one-half of your plate with vegetables and green salads. ? Fill one-fourth of your plate with whole grains. ? Fill one-fourth of your plate with low-fat (lean) protein foods.  Eat fish that is high in omega-3 fats at least two times a week. This includes mackerel, tuna, sardines, and salmon.  Eat foods that are high in fiber, such as whole grains, beans, apples, broccoli, carrots, peas, and barley. General tips   Work with your doctor to lose weight  if you need to.  Avoid: ? Foods with added sugar. ? Fried foods. ? Foods with partially hydrogenated oils.  Limit alcohol intake to no more than 1 drink a day for nonpregnant women and 2 drinks a day for men. One drink equals 12 oz of beer, 5 oz of wine, or 1 oz of hard liquor. Reading food labels  Check food labels for: ? Trans fats. ? Partially hydrogenated oils. ? Saturated fat (g) in each serving. ? Cholesterol (mg) in each serving. ? Fiber (g) in each serving.  Choose foods with healthy fats, such as: ? Monounsaturated fats. ? Polyunsaturated fats. ?  Omega-3 fats.  Choose grain products that have whole grains. Look for the word "whole" as the first word in the ingredient list. Cooking  Cook foods using low-fat methods. These include baking, boiling, grilling, and broiling.  Eat more home-cooked foods. Eat at restaurants and buffets less often.  Avoid cooking using saturated fats, such as butter, cream, palm oil, palm kernel oil, and coconut oil. Recommended foods  Fruits  All fresh, canned (in natural juice), or frozen fruits. Vegetables  Fresh or frozen vegetables (raw, steamed, roasted, or grilled). Green salads. Grains  Whole grains, such as whole wheat or whole grain breads, crackers, cereals, and pasta. Unsweetened oatmeal, bulgur, barley, quinoa, or brown rice. Corn or whole wheat flour tortillas. Meats and other protein foods  Ground beef (85% or leaner), grass-fed beef, or beef trimmed of fat. Skinless chicken or [place]. Ground chicken or [place]. Pork trimmed of fat. All fish and seafood. Egg whites. Dried beans, peas, or lentils. Unsalted nuts or seeds. Unsalted canned beans. Nut butters without added sugar or oil. Dairy  Low-fat or nonfat dairy products, such as skim or 1% milk, 2% or reduced-fat cheeses, low-fat and fat-free ricotta or cottage cheese, or plain low-fat and nonfat yogurt. Fats and oils  Tub margarine without trans fats. Light or  reduced-fat mayonnaise and salad dressings. Avocado. Olive, canola, sesame, or safflower oils. The items listed above may not be a complete list of foods and beverages you can eat. Contact a dietitian for more information. Foods to avoid Fruits  Canned fruit in heavy syrup. Fruit in cream or butter sauce. Fried fruit. Vegetables  Vegetables cooked in cheese, cream, or butter sauce. Fried vegetables. Grains  White bread. White pasta. White rice. Cornbread. Bagels, pastries, and croissants. Crackers and snack foods that contain trans fat and hydrogenated oils. Meats and other protein foods  Fatty cuts of meat. Ribs, chicken wings, bacon, sausage, bologna, salami, chitterlings, fatback, hot dogs, bratwurst, and packaged lunch meats. Liver and organ meats. Whole eggs and egg yolks. Chicken and [place] with skin. Fried meat. Dairy  Whole or 2% milk, cream, half-and-half, and cream cheese. Whole milk cheeses. Whole-fat or sweetened yogurt. Full-fat cheeses. Nondairy creamers and whipped toppings. Processed cheese, cheese spreads, and cheese curds. Beverages  Alcohol. Sugar-sweetened drinks such as sodas, lemonade, and fruit drinks. Fats and oils  Butter, stick margarine, lard, shortening, ghee, or bacon fat. Coconut, palm kernel, and palm oils. Sweets and desserts  Corn syrup, sugars, honey, and molasses. Candy. Jam and jelly. Syrup. Sweetened cereals. Cookies, pies, cakes, donuts, muffins, and ice cream. The items listed above may not be a complete list of foods and beverages you should avoid. Contact a dietitian for more information. Summary  Choosing the right foods helps keep your fat and cholesterol at normal levels. This can keep you from getting certain diseases.  At meals, fill one-half of your plate with vegetables and green salads.  Eat high-fiber foods, like whole grains, beans, apples, carrots, peas, and barley.  Limit added sugar, saturated fats, alcohol, and fried  foods. This information is not intended to replace advice given to you by your health care provider. Make sure you discuss any questions you have with your health care provider. Document Revised: 05/14/2018 Document Reviewed: 05/28/2017 Elsevier Patient Education  2020 Elsevier Inc.  Preventing High Cholesterol Cholesterol is a white, waxy substance similar to fat that the human body needs to help build cells. The liver makes all the cholesterol that a person's body needs. Having high  cholesterol (hypercholesterolemia) increases a person's risk for heart disease and stroke. Extra (excess) cholesterol comes from the food the person eats. High cholesterol can often be prevented with diet and lifestyle changes. If you already have high cholesterol, you can control it with diet and lifestyle changes and with medicine. How can high cholesterol affect me? If you have high cholesterol, deposits (plaques) may build up on the walls of your arteries. The arteries are the blood vessels that carry blood away from your heart. Plaques make the arteries narrower and stiffer. This can limit or block blood flow and cause blood clots to form. Blood clots:  Are tiny balls of cells that form in your blood.  Can move to the heart or brain, causing a heart attack or stroke. Plaques in arteries greatly increase your risk for heart attack and stroke.Making diet and lifestyle changes can reduce your risk for these conditions that may threaten your life. What can increase my risk? This condition is more likely to develop in people who:  Eat foods that are high in saturated fat or cholesterol. Saturated fat is mostly found in: ? Foods that contain animal fat, such as red meat and some dairy products. ? Certain fatty foods made from plants, such as tropical oils.  Are overweight.  Are not getting enough exercise.  Have a family history of high cholesterol. What actions can I take to prevent  this? Nutrition   Eat less saturated fat.  Avoid trans fats (partially hydrogenated oils). These are often found in margarine and in some baked goods, fried foods, and snacks bought in packages.  Avoid precooked or cured meat, such as sausages or meat loaves.  Avoid foods and drinks that have added sugars.  Eat more fruits, vegetables, and whole grains.  Choose healthy sources of protein, such as fish, poultry, lean cuts of red meat, beans, peas, lentils, and nuts.  Choose healthy sources of fat, such as: ? Nuts. ? Vegetable oils, especially olive oil. ? Fish that have healthy fats (omega-3 fatty acids), such as mackerel or salmon. The items listed above may not be a complete list of recommended foods and beverages. Contact a dietitian for more information. Lifestyle  Lose weight if you are overweight. Losing 5-10 lb (2.3-4.5 kg) can help prevent or control high cholesterol. It can also lower your risk for diabetes and high blood pressure. Ask your health care provider to help you with a diet and exercise plan to lose weight safely.  Do not use any products that contain nicotine or tobacco, such as cigarettes, e-cigarettes, and chewing tobacco. If you need help quitting, ask your health care provider.  Limit your alcohol intake. ? Do not drink alcohol if:  Your health care provider tells you not to drink.  You are pregnant, may be pregnant, or are planning to become pregnant. ? If you drink alcohol:  Limit how much you use to:  0-1 drink a day for women.  0-2 drinks a day for men.  Be aware of how much alcohol is in your drink. In the [place] one drink equals one 12 oz bottle of beer (355 mL), one 5 oz glass of wine (148 mL), or one 1 oz glass of hard liquor (44 mL). Activity   Get enough exercise. Each week, do at least 150 minutes of exercise that takes a medium level of effort (moderate-intensity exercise). ? This is exercise that:  Makes your heart beat faster and  makes you breathe harder than usual.  Allows you to still be able to talk. ? You could exercise in short sessions several times a day or longer sessions a few times a week. For example, on 5 days each week, you could walk fast or ride your bike 3 times a day for 10 minutes each time.  Do exercises as told by your health care provider. Medicines  In addition to diet and lifestyle changes, your health care provider may recommend medicines to help lower cholesterol. This may be a medicine to lower the amount of cholesterol your liver makes. You may need medicine if: ? Diet and lifestyle changes do not lower your cholesterol enough. ? You have high cholesterol and other risk factors for heart disease or stroke.  Take over-the-counter and prescription medicines only as told by your health care provider. General information  Manage your risk factors for high cholesterol. Talk with your health care provider about all your risk factors and how to lower your risk.  Manage other conditions that you have, such as diabetes or high blood pressure (hypertension).  Have blood tests to check your cholesterol levels at regular points in time as told by your health care provider.  Keep all follow-up visits as told by your health care provider. This is important. Where to find more information  American Heart Association: www.heart.org  National Heart, Lung, and Blood Institute: PopSteam.is Summary  High cholesterol increases your risk for heart disease and stroke. By keeping your cholesterol level low, you can reduce your risk for these conditions.  High cholesterol can often be prevented with diet and lifestyle changes.  Work with your health care provider to manage your risk factors, and have your blood tested regularly. This information is not intended to replace advice given to you by your health care provider. Make sure you discuss any questions you have with your health care  provider. Document Revised: 01/02/2019 Document Reviewed: 05/19/2016 Elsevier Patient Education  2020 [employer].

## 2020-05-04 LAB — COMPREHENSIVE METABOLIC PANEL
ALT: 19 IU/L (ref 0–32)
AST: 18 IU/L (ref 0–40)
Albumin/Globulin Ratio: 1.8 (ref 1.2–2.2)
Albumin: 4.6 g/dL (ref 3.8–4.9)
Alkaline Phosphatase: 95 IU/L (ref 48–121)
BUN/Creatinine Ratio: 16 (ref 9–23)
BUN: 14 mg/dL (ref 6–24)
Bilirubin Total: 0.3 mg/dL (ref 0.0–1.2)
CO2: 23 mmol/L (ref 20–29)
Calcium: 9.7 mg/dL (ref 8.7–10.2)
Chloride: 103 mmol/L (ref 96–106)
Creatinine, Ser: 0.87 mg/dL (ref 0.57–1.00)
GFR calc Af Amer: 87 mL/min/{1.73_m2} (ref >59)
GFR calc non Af Amer: 76 mL/min/{1.73_m2} (ref >59)
Globulin, Total: 2.5 g/dL (ref 1.5–4.5)
Glucose: 113 mg/dL — ABNORMAL HIGH (ref 65–99)
Potassium: 4.3 mmol/L (ref 3.5–5.2)
Sodium: 140 mmol/L (ref 134–144)
Total Protein: 7.1 g/dL (ref 6.0–8.5)

## 2020-05-04 LAB — LIPID PANEL
Chol/HDL Ratio: 2.8 ratio (ref 0.0–4.4)
Cholesterol, Total: 222 mg/dL — ABNORMAL HIGH (ref 100–199)
HDL: 79 mg/dL (ref >39)
LDL Chol Calc (NIH): 124 mg/dL — ABNORMAL HIGH (ref 0–99)
Triglycerides: 110 mg/dL (ref 0–149)
VLDL Cholesterol Cal: 19 mg/dL (ref 5–40)

## 2020-05-04 LAB — HEMOGLOBIN A1C
Est. average glucose Bld gHb Est-mCnc: 126 mg/dL
Hgb A1c MFr Bld: 6 % — ABNORMAL HIGH (ref 4.8–5.6)

## 2021-04-05 ENCOUNTER — Other Ambulatory Visit: Payer: Self-pay | Admitting: Primary Care

## 2021-04-05 DIAGNOSIS — Z1231 Encounter for screening mammogram for malignant neoplasm of breast: Secondary | ICD-10-CM

## 2021-05-08 ENCOUNTER — Encounter: Payer: Self-pay | Admitting: Emergency Medicine

## 2021-05-08 ENCOUNTER — Telehealth: Payer: Self-pay | Admitting: Emergency Medicine

## 2021-05-08 ENCOUNTER — Ambulatory Visit
Admission: EM | Admit: 2021-05-08 | Discharge: 2021-05-08 | Disposition: A | Discharge status: Home / Self Care | Payer: No Typology Code available for payment source | Attending: Emergency Medicine | Admitting: Emergency Medicine

## 2021-05-08 ENCOUNTER — Other Ambulatory Visit: Payer: Self-pay

## 2021-05-08 DIAGNOSIS — L509 Urticaria, unspecified: Secondary | ICD-10-CM

## 2021-05-08 MED ORDER — PREDNISONE 10 MG PO TABS: ORAL_TABLET | ORAL | 0 refills | Status: AC

## 2021-05-08 MED ORDER — PREDNISONE 10 MG PO TABS: ORAL_TABLET | ORAL | 0 refills | Status: DC

## 2021-05-08 NOTE — Discharge Instructions (Addendum)
Take prednisone as prescribed. Increase fluid intake. You may take Benadryl 25-50mg every 4-6 hours as needed OR Zyrtec 10mg daily for itching/inflammation. You may also take over the counter Famotidine 20mg once daily to help with itching/inflammation. You may apply ice wrapped in a towel to affected area 3-5 times daily for 10-15 minute intervals. See your PCP if rash does not improve in 5-6 days. See your PCP or return to clinic sooner if rash worsens or you develop fever.  

## 2021-05-08 NOTE — ED Provider Notes (Signed)
Chief Complaint   Chief Complaint  Patient presents with   Rash     Subjective, HPI  Jill Ball is a very pleasant 55 y.o. female who presents with rash which started on Thursday.  Patient reports taking Zyrtec.  Patient reports that about 2 weeks ago when the rash initially started she had changed her soap.  Patient states that she has now stopped using the soap, but is still having the rash.  Patient reports extreme itching.  She reports that the rash seems to come and go.  No new foods or known exposure to any continued allergens.  No fever or drainage from areas.  History obtained from patient.   Patient's problem list, past medical and social history, medications, and allergies were reviewed by me and updated in Epic.    ROS  See HPI.  Objective   Vitals:   05/08/21 1616  BP: 127/86  Pulse: 86  Resp: 20  Temp: 98.9 F (37.2 C)  SpO2: 100%    Vital signs and nursing note reviewed.  General: Appears well-developed and well-nourished. No acute distress.  HEENT: Normocephalic, atraumatic, hearing grossly intact. EOMI, no drainage. No rhinorrhea. Moist mucous membranes.  Neck: Normal range of motion, neck is supple.  Cardiovascular: Normal rate.  Pulm/Chest: No respiratory distress.   Musculoskeletal: No joint deformity, normal range of motion.  Skin: Hives noted over upper back, left arm, left leg, left and right buttocks.  Data  No results found for any visits on 05/08/21.    Assessment & Plan  1. Hives - predniSONE (DELTASONE) 10 MG tablet; Take 6 tablets (60 mg total) by mouth daily for 1 day, THEN 5 tablets (50 mg total) daily for 1 day, THEN 4 tablets (40 mg total) daily for 1 day, THEN 3 tablets (30 mg total) daily for 1 day, THEN 2 tablets (20 mg total) daily for 1 day, THEN 1 tablet (10 mg total) daily for 1 day.  Dispense: 21 tablet; Refill: 0   55 y.o. female presents with rash which started on Thursday.  Patient reports taking Zyrtec.  Patient reports  that about 2 weeks ago when the rash initially started she had changed her soap.  Patient states that she has now stopped using the soap, but is still having the rash.  Patient reports extreme itching.  She reports that the rash seems to come and go.  No new foods or known exposure to any continued allergens.  No fever or drainage from areas.  Chart review completed.  Given symptoms along with assessment findings, likely hives secondary to a potential allergen trigger.  Advised the patient that there was no way to know exactly what caused her rash, but did advise that if she continues to get rashes like this that she should probably follow-up with an allergist who can scratch test her.  Advised use of prednisone as prescribed today.  Also advised that she can use Benadryl, Zyrtec and famotidine.  Follow-up with PCP if rash does not improve in 5 to 6 days or sooner if rash worsens or if she begins to develop a fever.  Patient verbalized understanding and agreed with plan.  Patient stable upon discharge.  Plan:   Discharge Instructions      Take prednisone as prescribed. Increase fluid intake. You may take Benadryl 25-50mg  every 4-6 hours as needed OR Zyrtec 10mg  daily for itching/inflammation. You may also take over the counter Famotidine 20mg  once daily to help with itching/inflammation. You may apply  ice wrapped in a towel to affected area 3-5 times daily for 10-15 minute intervals. See your PCP if rash does not improve in 5-6 days. See your PCP or return to clinic sooner if rash worsens or you develop fever.         Amalia Greenhouse, FNP 05/08/21 1721

## 2021-05-08 NOTE — ED Triage Notes (Signed)
Pt here with intermittent rash since Thursday. Has been taking Zyrtec. Changed soap about 2 weeks ago, but nothing else is different. States it is itchy.

## 2021-05-15 ENCOUNTER — Ambulatory Visit: Payer: 59 | Admitting: Cardiovascular Disease

## 2021-07-19 ENCOUNTER — Other Ambulatory Visit: Payer: Self-pay | Admitting: Cardiovascular Disease

## 2022-01-01 ENCOUNTER — Encounter: Payer: Self-pay | Admitting: Cardiovascular Disease

## 2022-01-01 ENCOUNTER — Ambulatory Visit (INDEPENDENT_AMBULATORY_CARE_PROVIDER_SITE_OTHER): Payer: 59 | Admitting: Cardiovascular Disease

## 2022-01-01 ENCOUNTER — Other Ambulatory Visit: Payer: Self-pay | Admitting: Cardiovascular Disease

## 2022-01-01 VITALS — BP 142/86 | HR 78 | Ht 67.0 in | Wt 243.1 lb

## 2022-01-01 DIAGNOSIS — E782 Mixed hyperlipidemia: Secondary | ICD-10-CM | POA: Diagnosis not present

## 2022-01-01 DIAGNOSIS — I25118 Atherosclerotic heart disease of native coronary artery with other forms of angina pectoris: Secondary | ICD-10-CM

## 2022-01-01 DIAGNOSIS — I10 Essential (primary) hypertension: Secondary | ICD-10-CM | POA: Diagnosis not present

## 2022-01-01 DIAGNOSIS — R0602 Shortness of breath: Secondary | ICD-10-CM

## 2022-01-01 MED ORDER — AMLODIPINE BESYLATE 10 MG PO TABS: 10.0000 mg | ORAL_TABLET | Freq: Every day | ORAL | 3 refills | Status: AC

## 2022-01-01 MED ORDER — METOPROLOL TARTRATE 25 MG PO TABS: 25.0000 mg | ORAL_TABLET | Freq: Two times a day (BID) | ORAL | 3 refills | Status: AC

## 2022-01-01 MED ORDER — EZETIMIBE 10 MG PO TABS: 10.0000 mg | ORAL_TABLET | Freq: Every day | ORAL | 3 refills | Status: AC

## 2022-01-01 NOTE — Telephone Encounter (Signed)
Please review for refill. Patient overdue for appointment but scheduled to be seen today by Dr. Mariah Milling at 4pm. Thank you! ?

## 2022-01-01 NOTE — Telephone Encounter (Signed)
Please schedule overdue F/U appointment. Thank you! ?

## 2022-01-01 NOTE — Telephone Encounter (Signed)
Scheduled

## 2022-01-01 NOTE — Progress Notes (Signed)
Cardiology Office Note ? ?Date:  01/01/2022  ? ?ID:  Jill Ball, DOB June 17, 1966, MRN [id number] ? ?PCP:  Oswaldo Conroy, MD  ? ?Chief Complaint  ?Patient presents with  ? Follow-up  ?  "Doing well." Medications reviewed by the patient verbally.   ? ? ?HPI:  ?Jill Ball is a 56 y.o. African American female w/ PMHx s/f  ?Premature coronary disease, CAD (s/p DES-LCx 06/2013),  ?HTN,  ?HLD  ?No prior smoking history, nondiabetic ?who presents today for follow-up of her CAD ? ?Last seen in clinic by myself August 2021 ?On that visit was not taking Lipitor, preferred to manage cholesterol through diet and exercise ?No available lipid panel since that time ? ?Reports she is exercising, stretching on a regular basis ?Having muscle cramps ?Would prefer not to be on statins if that has the potential to put give her cramping ? ?Denies chest pain concerning for angina ?Weight continues to run high but she is working on this ? ?EKG personally reviewed by myself on todays visit ?Normal sinus rhythm rate 78 bpm left axis deviation, T wave abnormality ? ?Prior catheterization results discussed from 2014 ? 90% LCx lesion treated with a DES. She had residual 50% LAD disease. EF > 55%. Coronary vasospasm was noted. She was started on DAPT- ASA/Effient. ? ?Previous 2D echo indicated EF 50-55%, impaired LV diastolic dysfunction, mild LA dilatation, mild MR, mild-mod AI, mild TR, RVSP 40 mmHg and mildly elevated PASP.  ? ?PMH:   has a past medical history of Coronary artery disease, Hyperlipidemia, Hypertension, MI (myocardial infarction) (HCC), and Syncope and collapse. ? ?PSH:    ?Past Surgical History:  ?Procedure Laterality Date  ? CARDIAC CATHETERIZATION  06/2013  ? [facility]  ? CORONARY ANGIOPLASTY  2014  ? stent placement x 1   ? ? ?Current Outpatient Medications  ?Medication Sig Dispense Refill  ? aspirin 81 MG tablet Take 81 mg by mouth daily.    ? ezetimibe (ZETIA) 10 MG tablet Take 1 tablet (10 mg total) by mouth daily. 90  tablet 3  ? amLODipine (NORVASC) 10 MG tablet Take 1 tablet (10 mg total) by mouth daily. 90 tablet 3  ? metoprolol tartrate (LOPRESSOR) 25 MG tablet Take 1 tablet (25 mg total) by mouth 2 (two) times daily. 180 tablet 3  ? ?No current facility-administered medications for this visit.  ? ? ?Allergies:   Patient has no known allergies.  ? ?Social History:  The patient  reports that she has never smoked. She has never used smokeless tobacco. She reports current alcohol use. She reports that she does not use drugs.  ? ?Family History:   family history includes Diabetes in her mother; Hypertension in her father and mother.  ? ? ?Review of Systems: ?Review of Systems  ?Constitutional: Negative.   ?HENT: Negative.    ?Respiratory: Negative.    ?Cardiovascular: Negative.   ?Gastrointestinal: Negative.   ?Musculoskeletal: Negative.   ?Neurological: Negative.   ?Psychiatric/Behavioral: Negative.    ?All other systems reviewed and are negative. ? ?PHYSICAL EXAM: ?VS:  BP (!) 142/86 (BP Location: Left Arm, Patient Position: Sitting, Cuff Size: Large)   Pulse 78   Ht 5\' 7"  (1.702 m)   Wt 110.3 kg   SpO2 98%   BMI 38.08 kg/m?  , BMI Body mass index is 38.08 kg/m? ?Constitutional:  oriented to person, place, and time. No distress.  ?HENT:  ?Head: Grossly normal ?Eyes:  no discharge. No scleral icterus.  ?Neck: No  JVD, no carotid bruits  ?Cardiovascular: Regular rate and rhythm, no murmurs appreciated ?Pulmonary/Chest: Clear to auscultation bilaterally, no wheezes or rails ?Abdominal: Soft.  no distension.  no tenderness.  ?Musculoskeletal: Normal range of motion ?Neurological:  normal muscle tone. Coordination normal. No atrophy ?Skin: Skin warm and dry ?Psychiatric: normal affect, pleasant ? ?Recent Labs: ?No results found for requested labs within last 8760 hours.  ? ? ?Lipid Panel ?Lab Results  ?Component Value Date  ? CHOL 222 (H) 05/03/2020  ? HDL 79 05/03/2020  ? LDLCALC 124 (H) 05/03/2020  ? TRIG 110 05/03/2020   ? ? ?Wt Readings from Last 3 Encounters:  ?01/01/22 110.3 kg  ?05/03/20 108.1 kg  ?04/09/18 106.6 kg  ?  ? ?ASSESSMENT AND PLAN: ? ?Problem List Items Addressed This Visit   ? ? Essential hypertension  ? Relevant Medications  ? amLODipine (NORVASC) 10 MG tablet  ? metoprolol tartrate (LOPRESSOR) 25 MG tablet  ? ezetimibe (ZETIA) 10 MG tablet  ? CAD (coronary artery disease) - Primary  ? Relevant Medications  ? amLODipine (NORVASC) 10 MG tablet  ? metoprolol tartrate (LOPRESSOR) 25 MG tablet  ? ezetimibe (ZETIA) 10 MG tablet  ? Other Relevant Orders  ? EKG 12-Lead  ? Hyperlipidemia  ? Relevant Medications  ? amLODipine (NORVASC) 10 MG tablet  ? metoprolol tartrate (LOPRESSOR) 25 MG tablet  ? ezetimibe (ZETIA) 10 MG tablet  ? Other Relevant Orders  ? Lipid Profile  ? Shortness of breath  ?Coronary disease with stable angina ?Denies anginal symptoms ?Discussed prior cardiac catheterization, ?On last clinic visit was off her Lipitor ?As on prior clinic visit 2021 she is not on a cholesterol medication on today's visit ?Prefers not to be on a statin, concerned about side effects ?Long discussion concerning Zetia and PCSK9 inhibitor ?Willing to try Zetia 10 mg daily ?We have recommended lipid panel in 2 to 3 months time on Zetia ?Goal LDL less than 70, these were discussed with her ? ?Hyperlipidemia ?Stressed importance of aggressive control, goal LDL less than 70 ?Start Zetia as above ? ?Essential hypertension ?Blood pressure mildly elevated, recommend she closely monitor numbers at home ?Appears she has stopped taking losartan 100 mg daily which she was on on her last clinic visit.  Reports she does not want to restart losartan as blood pressure well controlled at home ? ?Diabetes type 2 with complications ?We have encouraged continued exercise, careful diet management in an effort to lose weight. ? ? Total encounter time more than 30 minutes ? Greater than 50% was spent in counseling and coordination of care with the  patient ? ? ? ?Signed, ?Dossie Arbour, M.D., Ph.D. ?[facility] [place], [place] ?[phone] ?

## 2022-01-01 NOTE — Patient Instructions (Addendum)
Medication Instructions:  ?Start Zetia 10 mg daily ? ?Goal LDL <70 ? ?If you need a refill on your cardiac medications before your next appointment, please call your pharmacy.  ? ?Lab work: ?Your physician recommends that you return for a FASTING lipid profile: In 2 to 3 months. ? ?- You will need to be fasting. Please do not have anything to eat or drink after midnight the morning you have the lab work. You may only have water or black coffee with no cream or sugar.  ? ?We will call you closer to your labs due date to remind you to go to the [facility] for your labs.  ? ?[facility] Entrance at [facility] ?1st desk on the right to check in, past the screening table ?Lab hours: Monday- Friday (7:30 am- 5:30 pm)  ? ?Testing/Procedures: ?No new testing needed ? ?Follow-Up: ?At [facility], you and your health needs are our priority.  As part of our continuing mission to provide you with exceptional heart care, we have created designated Provider Care Teams.  These Care Teams include your primary Cardiologist (physician) and Advanced Practice Providers (APPs -  Physician Assistants and Nurse Practitioners) who all work together to provide you with the care you need, when you need it. ? ?You will need a follow up appointment in 12 months ? ?Providers on your designated Care Team:   ?Murray Hodgkins, NP ?Christell Faith, PA-C ?Cadence Kathlen Mody, PA-C ? ?COVID-19 Vaccine Information can be found at: [email or web address].uk For questions related to vaccine distribution or appointments, please email [email or web address] .[email or web address] or call [phone].  ? ?

## 2022-01-09 ENCOUNTER — Ambulatory Visit: Payer: No Typology Code available for payment source | Admitting: Cardiovascular Disease

## 2023-03-21 ENCOUNTER — Other Ambulatory Visit: Payer: Self-pay | Admitting: Physician Assistant

## 2023-03-21 DIAGNOSIS — Z1231 Encounter for screening mammogram for malignant neoplasm of breast: Secondary | ICD-10-CM

## 2023-04-12 ENCOUNTER — Ambulatory Visit
Admission: RE | Admit: 2023-04-12 | Discharge: 2023-04-12 | Disposition: A | Discharge status: Home / Self Care | Payer: Medicaid Other | Source: Ambulatory Visit | Attending: Physician Assistant | Admitting: Physician Assistant

## 2023-04-12 DIAGNOSIS — Z1231 Encounter for screening mammogram for malignant neoplasm of breast: Secondary | ICD-10-CM | POA: Insufficient documentation

## 2023-05-22 ENCOUNTER — Other Ambulatory Visit: Payer: Self-pay

## 2023-05-22 ENCOUNTER — Encounter (HOSPITAL_COMMUNITY): Payer: Self-pay | Admitting: Emergency Medicine

## 2023-05-22 ENCOUNTER — Emergency Department (HOSPITAL_COMMUNITY)
Admission: EM | Admit: 2023-05-22 | Discharge: 2023-05-23 | Disposition: A | Discharge status: Home / Self Care | Payer: Medicaid Other | Attending: Emergency Medicine | Admitting: Emergency Medicine

## 2023-05-22 DIAGNOSIS — S161XXA Strain of muscle, fascia and tendon at neck level, initial encounter: Secondary | ICD-10-CM | POA: Insufficient documentation

## 2023-05-22 DIAGNOSIS — Y92410 Unspecified street and highway as the place of occurrence of the external cause: Secondary | ICD-10-CM | POA: Diagnosis not present

## 2023-05-22 DIAGNOSIS — Z7982 Long term (current) use of aspirin: Secondary | ICD-10-CM | POA: Insufficient documentation

## 2023-05-22 DIAGNOSIS — M542 Cervicalgia: Secondary | ICD-10-CM | POA: Diagnosis present
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
## 2023-05-23 ENCOUNTER — Emergency Department (HOSPITAL_COMMUNITY): Payer: Medicaid Other

## 2023-05-23 MED ORDER — CYCLOBENZAPRINE HCL 10 MG PO TABS: 10.0000 mg | ORAL_TABLET | Freq: Two times a day (BID) | ORAL | 0 refills | Status: DC | PRN

## 2023-05-23 MED ORDER — KETOROLAC TROMETHAMINE 60 MG/2ML IM SOLN
30.0000 mg | Freq: Once | INTRAMUSCULAR | Status: AC
Start: 2023-05-23 — End: 2023-05-23
  Administered 2023-05-23: 30 mg via INTRAMUSCULAR
  Filled 2023-05-23: qty 2

## 2023-05-23 MED ORDER — CYCLOBENZAPRINE HCL 10 MG PO TABS: 10.0000 mg | ORAL_TABLET | Freq: Two times a day (BID) | ORAL | 0 refills | Status: AC | PRN

## 2023-05-23 NOTE — ED Notes (Signed)
Patient transported to X-ray 
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
## 2023-06-28 ENCOUNTER — Ambulatory Visit: Payer: Medicaid Other | Admitting: Physical Therapy

## 2024-05-20 ENCOUNTER — Other Ambulatory Visit: Payer: Self-pay | Admitting: Family Medicine

## 2024-05-20 DIAGNOSIS — Z1231 Encounter for screening mammogram for malignant neoplasm of breast: Secondary | ICD-10-CM

## 2024-06-22 NOTE — Progress Notes (Deleted)
 Cardiology Office Note  Date:  06/22/2024   ID:  Jill Ball, DOB 1965-12-09, MRN [id number]  PCP:  Kandis Stefano Iles, MD   No chief complaint on file.   HPI:  Jill Ball is a 58 y.o. African American female w/ PMHx s/f  Premature coronary disease, CAD (s/p DES-LCx 06/2013),  HTN,  HLD  No prior smoking history, nondiabetic who presents today for follow-up of her CAD  Last seen in clinic by myself 4/23   On that visit was not taking Lipitor, preferred to manage cholesterol through diet and exercise No available lipid panel since that time  Reports she is exercising, stretching on a regular basis Having muscle cramps Would prefer not to be on statins if that has the potential to put give her cramping  Denies chest pain concerning for angina Weight continues to run high but she is working on this  EKG personally reviewed by myself on todays visit Normal sinus rhythm rate 78 bpm left axis deviation, T wave abnormality  Prior catheterization results discussed from 2014  90% LCx lesion treated with a DES. She had residual 50% LAD disease. EF > 55%. Coronary vasospasm was noted. She was started on DAPT- ASA/Effient.  Previous 2D echo indicated EF 50-55%, impaired LV diastolic dysfunction, mild LA dilatation, mild MR, mild-mod AI, mild TR, RVSP 40 mmHg and mildly elevated PASP.   PMH:   has a past medical history of Coronary artery disease, Hyperlipidemia, Hypertension, MI (myocardial infarction) (HCC), and Syncope and collapse.  PSH:    Past Surgical History:  Procedure Laterality Date   CARDIAC CATHETERIZATION  06/2013   [facility]   CORONARY ANGIOPLASTY  2014   stent placement x 1     Current Outpatient Medications  Medication Sig Dispense Refill   amLODipine  (NORVASC ) 10 MG tablet Take 1 tablet (10 mg total) by mouth daily. 90 tablet 3   aspirin 81 MG tablet Take 81 mg by mouth daily.     cyclobenzaprine  (FLEXERIL ) 10 MG tablet Take 1 tablet (10 mg total) by mouth 2  (two) times daily as needed for muscle spasms. 20 tablet 0   ezetimibe  (ZETIA ) 10 MG tablet Take 1 tablet (10 mg total) by mouth daily. 90 tablet 3   metoprolol  tartrate (LOPRESSOR ) 25 MG tablet Take 1 tablet (25 mg total) by mouth 2 (two) times daily. 180 tablet 3   No current facility-administered medications for this visit.    Allergies:   Patient has no known allergies.   Social History:  The patient  reports that she has never smoked. She has never used smokeless tobacco. She reports current alcohol use. She reports that she does not use drugs.   Family History:   family history includes Diabetes in her mother; Hypertension in her father and mother.    Review of Systems: Review of Systems  Constitutional: Negative.   HENT: Negative.    Respiratory: Negative.    Cardiovascular: Negative.   Gastrointestinal: Negative.   Musculoskeletal: Negative.   Neurological: Negative.   Psychiatric/Behavioral: Negative.    All other systems reviewed and are negative.   PHYSICAL EXAM: VS:  There were no vitals taken for this visit. , BMI There is no height or weight on file to calculate BMI. Constitutional:  oriented to person, place, and time. No distress.  HENT:  Head: Grossly normal Eyes:  no discharge. No scleral icterus.  Neck: No JVD, no carotid bruits  Cardiovascular: Regular rate and rhythm, no murmurs appreciated Pulmonary/Chest:  Clear to auscultation bilaterally, no wheezes or rails Abdominal: Soft.  no distension.  no tenderness.  Musculoskeletal: Normal range of motion Neurological:  normal muscle tone. Coordination normal. No atrophy Skin: Skin warm and dry Psychiatric: normal affect, pleasant  Recent Labs: No results found for requested labs within last 365 days.    Lipid Panel Lab Results  Component Value Date   CHOL 222 (H) 05/03/2020   HDL 79 05/03/2020   LDLCALC 124 (H) 05/03/2020   TRIG 110 05/03/2020    Wt Readings from Last 3 Encounters:  01/01/22 243  lb 2 oz (110.3 kg)  05/03/20 238 lb 4 oz (108.1 kg)  04/09/18 235 lb (106.6 kg)     ASSESSMENT AND PLAN:  Problem List Items Addressed This Visit   None  Coronary disease with stable angina Denies anginal symptoms Discussed prior cardiac catheterization, On last clinic visit was off her Lipitor As on prior clinic visit 2021 she is not on a cholesterol medication on today's visit Prefers not to be on a statin, concerned about side effects Long discussion concerning Zetia  and PCSK9 inhibitor Willing to try Zetia  10 mg daily We have recommended lipid panel in 2 to 3 months time on Zetia  Goal LDL less than 70, these were discussed with her  Hyperlipidemia Stressed importance of aggressive control, goal LDL less than 70 Start Zetia  as above  Essential hypertension Blood pressure mildly elevated, recommend she closely monitor numbers at home Appears she has stopped taking losartan  100 mg daily which she was on on her last clinic visit.  Reports she does not want to restart losartan  as blood pressure well controlled at home  Diabetes type 2 with complications We have encouraged continued exercise, careful diet management in an effort to lose weight.   Total encounter time more than 30 minutes  Greater than 50% was spent in counseling and coordination of care with the patient    Signed, Velinda Lunger, M.D., Ph.D. [facility] [place], [place] [phone]

## 2024-06-23 ENCOUNTER — Ambulatory Visit: Admitting: Cardiovascular Disease

## 2024-08-11 ENCOUNTER — Ambulatory Visit: Admitting: Cardiovascular Disease
